# Patient Record
Sex: Male | Born: 1975 | Race: White | Hispanic: No | Marital: Married | Smoking: Former smoker
Health system: Southern US, Community
[De-identification: ages and names within clinical notes are randomized; demographics above are authoritative.]

## PROBLEM LIST (undated history)

## (undated) DIAGNOSIS — Z8719 Personal history of other diseases of the digestive system: Secondary | ICD-10-CM

## (undated) DIAGNOSIS — G35 Multiple sclerosis: Secondary | ICD-10-CM

## (undated) DIAGNOSIS — E669 Obesity, unspecified: Secondary | ICD-10-CM

## (undated) DIAGNOSIS — H469 Unspecified optic neuritis: Secondary | ICD-10-CM

## (undated) HISTORY — PX: HERNIA REPAIR: SHX51

## (undated) HISTORY — DX: Unspecified optic neuritis: H46.9

## (undated) HISTORY — DX: Multiple sclerosis: G35

## (undated) HISTORY — DX: Obesity, unspecified: E66.9

---

## 2013-09-20 ENCOUNTER — Encounter: Payer: Self-pay | Admitting: Neurology

## 2013-09-22 ENCOUNTER — Encounter: Payer: Self-pay | Admitting: Neurology

## 2013-09-22 ENCOUNTER — Ambulatory Visit (INDEPENDENT_AMBULATORY_CARE_PROVIDER_SITE_OTHER): Payer: Commercial Indemnity | Admitting: Neurology

## 2013-09-22 VITALS — BP 142/87 | HR 96 | Ht 73.0 in | Wt 303.0 lb

## 2013-09-22 DIAGNOSIS — G35 Multiple sclerosis: Secondary | ICD-10-CM

## 2013-09-22 DIAGNOSIS — Z5181 Encounter for therapeutic drug level monitoring: Secondary | ICD-10-CM

## 2013-09-22 DIAGNOSIS — G35A Relapsing-remitting multiple sclerosis: Secondary | ICD-10-CM | POA: Insufficient documentation

## 2013-09-22 HISTORY — DX: Multiple sclerosis: G35

## 2013-09-22 NOTE — Progress Notes (Signed)
Reason for visit: Multiple sclerosis  Daniel Chavez is a 37 y.o. male  History of present illness:  Daniel Chavez is a 37 year old left-handed white male with a history of multiple sclerosis that was diagnosed about 6 years ago. The patient presented at that time of the left optic neuritis, which has improved over time. The patient underwent MRI evaluation that showed 12 brain lesions. The patient was started on Copaxone. The patient never underwent a lumbar puncture. The patient has tolerated the Copaxone quite well over time, and he reports no further deficits of numbness or weakness of the face, arms, or legs. The patient denies any gait disturbance, or problems controlling the bowels or the bladder. The patient denies any significant problems with fatigue. The patient recently moved to this area from the [place], [place] area, and he seeks a new neurologist. The patient does not have a primary care physician in this area.  Past Medical History  Diagnosis Date  . MS (multiple sclerosis)   . Multiple sclerosis 09/22/2013  . Obesity   . Optic neuritis, left     Past Surgical History  Procedure Laterality Date  . Hernia repair      2005/2007 Left siide    Family History  Problem Relation Age of Onset  . Leukemia Sister     Social history:  reports that he has quit smoking. He has never used smokeless tobacco. He reports that  drinks alcohol. He reports that he does not use illicit drugs.  Medications:  No current outpatient prescriptions on file prior to visit.   No current facility-administered medications on file prior to visit.     No Known Allergies  ROS:  Out of a complete 14 system review of symptoms, the patient complains only of the following symptoms, and all other reviewed systems are negative.  The patient reports no symptoms.  Blood pressure 142/87, pulse 96, height 6\' 1"  (1.854 m), weight 303 lb (137.44 kg).  Physical Exam  General: The patient is  alert and cooperative at the time of the examination. The patient is markedly obese.  Head: Pupils are equal, round, and reactive to light. Discs are flat bilaterally.  Neck: The neck is supple, no carotid bruits are noted.  Respiratory: The respiratory examination is clear.  Cardiovascular: The cardiovascular examination reveals a regular rate and rhythm, no obvious murmurs or rubs are noted.  Skin: Extremities are without significant edema. The gastrocnemius muscle on the right is larger than that on the left.  Neurologic Exam  Mental status:  Cranial nerves: Facial symmetry is present. There is good sensation of the face to pinprick and soft touch bilaterally. The strength of the facial muscles and the muscles to head turning and shoulder shrug are normal bilaterally. Speech is well enunciated, no aphasia or dysarthria is noted. Extraocular movements are full. Visual fields are full.  Motor: The motor testing reveals 5 over 5 strength of all 4 extremities. Good symmetric motor tone is noted throughout.  Sensory: Sensory testing is intact to pinprick, soft touch, vibration sensation, and position sense on all 4 extremities. No evidence of extinction is noted.  Coordination: Cerebellar testing reveals good finger-nose-finger and heel-to-shin bilaterally.  Gait and station: Gait is normal. Tandem gait is normal. Romberg is negative. No drift is seen.  Reflexes: Deep tendon reflexes are symmetric and normal bilaterally. Toes are downgoing bilaterally.   Assessment/Plan:  One. Multiple sclerosis  The patient has been on Copaxone on a daily basis, and he wishes  to switch to a 3 times a week regimen. The patient has not been taking his medications on a regular basis. The patient has clinically been doing quite well. The patient will have a repeat MRI of the brain and cervical spine with and without gadolinium enhancement. Blood work will be done today. The patient will followup in 6  months.  Daniel Palau MD 09/22/2013 5:01 PM  [facility] [address] [place], [place] [address]  Phone [phone] Fax [phone]

## 2013-09-23 LAB — COMPREHENSIVE METABOLIC PANEL
ALT: 37 IU/L (ref 0–44)
AST: 22 IU/L (ref 0–40)
Albumin/Globulin Ratio: 2 (ref 1.1–2.5)
Albumin: 4.6 g/dL (ref 3.5–5.5)
Alkaline Phosphatase: 74 IU/L (ref 39–117)
BUN/Creatinine Ratio: 19 (ref 8–19)
BUN: 21 mg/dL — ABNORMAL HIGH (ref 6–20)
CO2: 26 mmol/L (ref 18–29)
Calcium: 9.8 mg/dL (ref 8.7–10.2)
Chloride: 101 mmol/L (ref 97–108)
Creatinine, Ser: 1.08 mg/dL (ref 0.76–1.27)
GFR calc Af Amer: 99 mL/min/{1.73_m2} (ref >59)
GFR calc non Af Amer: 86 mL/min/{1.73_m2} (ref >59)
Globulin, Total: 2.3 g/dL (ref 1.5–4.5)
Glucose: 90 mg/dL (ref 65–99)
Potassium: 4.4 mmol/L (ref 3.5–5.2)
Sodium: 144 mmol/L (ref 134–144)
Total Bilirubin: 0.5 mg/dL (ref 0.0–1.2)
Total Protein: 6.9 g/dL (ref 6.0–8.5)

## 2013-09-23 LAB — CBC WITH DIFFERENTIAL
Basophils Absolute: 0 10*3/uL (ref 0.0–0.2)
Basos: 0 %
Eos: 2 %
Eosinophils Absolute: 0.1 10*3/uL (ref 0.0–0.4)
HCT: 42.4 % (ref 37.5–51.0)
Hemoglobin: 14.5 g/dL (ref 12.6–17.7)
Immature Grans (Abs): 0 10*3/uL (ref 0.0–0.1)
Immature Granulocytes: 0 %
Lymphocytes Absolute: 1.7 10*3/uL (ref 0.7–3.1)
Lymphs: 21 %
MCH: 30.1 pg (ref 26.6–33.0)
MCHC: 34.2 g/dL (ref 31.5–35.7)
MCV: 88 fL (ref 79–97)
Monocytes Absolute: 0.7 10*3/uL (ref 0.1–0.9)
Monocytes: 8 %
Neutrophils Absolute: 5.4 10*3/uL (ref 1.4–7.0)
Neutrophils Relative %: 69 %
Platelets: 265 10*3/uL (ref 150–379)
RBC: 4.82 x10E6/uL (ref 4.14–5.80)
RDW: 14.1 % (ref 12.3–15.4)
WBC: 7.9 10*3/uL (ref 3.4–10.8)

## 2013-09-23 NOTE — Progress Notes (Signed)
Quick Note:  LMVM for pt that lab results unremarkable. Pt to call back as needed. ______

## 2013-09-29 ENCOUNTER — Telehealth: Payer: Self-pay | Admitting: Neurology

## 2013-09-30 NOTE — Telephone Encounter (Signed)
I called and spoke to Brett Canales  With [employer].  [phone] and he stated received the new prescription on Friday.  They had tried to verify the pharmacy with pt, was not able to reach.   I gave him the [facility] and [phone].  They will send to pharmacy for processing and this may take few days or could be longer if needs authorization. I LMVM for pt relating to this.

## 2013-10-14 ENCOUNTER — Ambulatory Visit
Admission: RE | Admit: 2013-10-14 | Discharge: 2013-10-14 | Disposition: A | Discharge status: Home / Self Care | Payer: Commercial Indemnity | Source: Ambulatory Visit | Attending: Neurology | Admitting: Neurology

## 2013-10-14 DIAGNOSIS — G35 Multiple sclerosis: Secondary | ICD-10-CM

## 2013-10-14 MED ORDER — GADOBENATE DIMEGLUMINE 529 MG/ML IV SOLN
20.0000 mL | Freq: Once | INTRAVENOUS | Status: AC | PRN
  Administered 2013-10-14: 20 mL via INTRAVENOUS

## 2013-10-18 ENCOUNTER — Telehealth: Payer: Self-pay | Admitting: Neurology

## 2013-10-18 NOTE — Telephone Encounter (Signed)
I called the patient. The MRI of the brain and cervical cord showed chronic MS lesions of the brain and brain stem. No lesions of the cord. Black holes seen. Will stick with the Copaxone for now, will use this study as a comparison for the future.

## 2014-03-24 ENCOUNTER — Ambulatory Visit: Payer: Commercial Indemnity | Admitting: Nurse Practitioner

## 2014-03-29 ENCOUNTER — Ambulatory Visit: Payer: Commercial Indemnity | Admitting: Nurse Practitioner

## 2014-03-29 ENCOUNTER — Telehealth: Payer: Self-pay | Admitting: Nurse Practitioner

## 2014-03-29 NOTE — Telephone Encounter (Signed)
No show for scheduled appt

## 2017-07-20 ENCOUNTER — Emergency Department (HOSPITAL_COMMUNITY)
Admission: EM | Admit: 2017-07-20 | Discharge: 2017-07-21 | Disposition: A | Discharge status: Home / Self Care | Payer: Managed Care, Other (non HMO) | Attending: Emergency Medicine | Admitting: Emergency Medicine

## 2017-07-20 ENCOUNTER — Encounter (HOSPITAL_COMMUNITY): Payer: Self-pay | Admitting: Emergency Medicine

## 2017-07-20 DIAGNOSIS — Z87891 Personal history of nicotine dependence: Secondary | ICD-10-CM | POA: Insufficient documentation

## 2017-07-20 DIAGNOSIS — Y92007 Garden or yard of unspecified non-institutional (private) residence as the place of occurrence of the external cause: Secondary | ICD-10-CM | POA: Diagnosis not present

## 2017-07-20 DIAGNOSIS — G35 Multiple sclerosis: Secondary | ICD-10-CM | POA: Diagnosis not present

## 2017-07-20 DIAGNOSIS — W208XXA Other cause of strike by thrown, projected or falling object, initial encounter: Secondary | ICD-10-CM | POA: Insufficient documentation

## 2017-07-20 DIAGNOSIS — S0592XA Unspecified injury of left eye and orbit, initial encounter: Secondary | ICD-10-CM | POA: Diagnosis present

## 2017-07-20 DIAGNOSIS — S0502XA Injury of conjunctiva and corneal abrasion without foreign body, left eye, initial encounter: Secondary | ICD-10-CM | POA: Insufficient documentation

## 2017-07-20 DIAGNOSIS — Y93H2 Activity, gardening and landscaping: Secondary | ICD-10-CM | POA: Insufficient documentation

## 2017-07-20 DIAGNOSIS — Y999 Unspecified external cause status: Secondary | ICD-10-CM | POA: Insufficient documentation

## 2017-07-20 DIAGNOSIS — K439 Ventral hernia without obstruction or gangrene: Secondary | ICD-10-CM | POA: Insufficient documentation

## 2017-07-20 MED ORDER — ERYTHROMYCIN 5 MG/GM OP OINT: TOPICAL_OINTMENT | Freq: Four times a day (QID) | OPHTHALMIC | 0 refills | Status: DC

## 2017-07-20 MED ORDER — FLUORESCEIN SODIUM 0.6 MG OP STRP
1.0000 | ORAL_STRIP | Freq: Once | OPHTHALMIC | Status: AC
  Administered 2017-07-20: 1 via OPHTHALMIC
  Filled 2017-07-20: qty 1

## 2017-07-20 NOTE — ED Provider Notes (Signed)
[facility] DEPT Provider Note   CSN: [id number] Arrival date & time: 07/20/17  2053   By signing my name below, I, Eunice Blase, attest that this documentation has been prepared under the direction and in the presence of Delora Fuel, MD. Electronically signed, Eunice Blase, ED Scribe. 07/20/17. 11:33 PM.   History   Chief Complaint Chief Complaint  Patient presents with  . Eye Injury  . Hernia   The history is provided by the patient and medical records. No language interpreter was used.    Daniel Chavez is a 41 y.o. male presenting to the Emergency Department concerning blurred vision to the L eye onset earlier today. He states he was using the weed eater when something struck the eye. Associated foreign body sensation and redness to the eye. He states he was wearing contacts when his eye was struck. Pt states he has voluntarily discontinued MS treatment ~4 years ago. Last tetanus 6-7 years ago. Pt followed by Dr. Sheryn Bison at [facility] for primary care. No pain.  He also notes RLQ abdominal pain. H/o hernia repair to the affected area. He states the sensation feels similar to his prior hernia. No fever, chills, diaphoresis, N/V or any other complaints noted at this time. No other complaints at this time.   Past Medical History:  Diagnosis Date  . MS (multiple sclerosis) ([place])   . Multiple sclerosis ([place]) 09/22/2013  . Obesity   . Optic neuritis, left     Patient Active Problem List   Diagnosis Date Noted  . Multiple sclerosis ([place]) 09/22/2013    Past Surgical History:  Procedure Laterality Date  . HERNIA REPAIR     2005/2007 Left siide       Home Medications    Prior to Admission medications   Not on File    Family History Family History  Problem Relation Age of Onset  . Leukemia Sister     Social History Social History  Substance Use Topics  . Smoking status: Former Research scientist (life sciences)  . Smokeless tobacco: Never Used     Comment: quit 03/2006  . Alcohol use Yes       Comment: weekly     Allergies   Patient has no known allergies.   Review of Systems Review of Systems  Constitutional: Negative for fever.  HENT: Negative for facial swelling.   Eyes: Positive for pain, redness and visual disturbance. Negative for photophobia.  Gastrointestinal: Positive for abdominal pain. Negative for nausea and vomiting.  Skin: Negative for wound.  Neurological: Negative for weakness, numbness and headaches.  All other systems reviewed and are negative.    Physical Exam Updated Vital Signs BP 129/87 (BP Location: Right Arm)   Pulse 69   Temp 98.1 F (36.7 C) (Oral)   Resp 18   Ht 6' 1"  (1.854 m)   Wt 240 lb (108.9 kg)   SpO2 96%   BMI 31.66 kg/m   Physical Exam  Constitutional: He is oriented to person, place, and time. He appears well-developed and well-nourished.  HENT:  Head: Normocephalic and atraumatic.  Eyes: Pupils are equal, round, and reactive to light. EOM are normal.  Corneal abrasion to the central cornea of the L eye. No hyphema.   Neck: Normal range of motion. Neck supple. No JVD present.  Cardiovascular: Normal rate, regular rhythm and normal heart sounds.   No murmur heard. Pulmonary/Chest: Effort normal and breath sounds normal. He has no wheezes. He has no rales. He exhibits no tenderness.  Abdominal: Soft. Bowel  sounds are normal. He exhibits no distension and no mass. There is no tenderness. A hernia is present. Hernia confirmed positive in the ventral area.  Surgical scar to lower abdomen. Small ventral hernia to upper abdomen.  Musculoskeletal: Normal range of motion. He exhibits no edema.  Lymphadenopathy:    He has no cervical adenopathy.  Neurological: He is alert and oriented to person, place, and time. No cranial nerve deficit. He exhibits normal muscle tone. Coordination normal.  Skin: Skin is warm and dry. No rash noted.  Psychiatric: He has a normal mood and affect. His behavior is normal. Judgment and thought  content normal.  Nursing note and vitals reviewed.  ED Treatments / Results  DIAGNOSTIC STUDIES: Oxygen Saturation is 96% on RA, adequate by my interpretation.    COORDINATION OF CARE: 11:26 PM-Discussed next steps with pt. Pt verbalized understanding and is agreeable with the plan. Pt prepared for eye exam. Will provide referrals.  Procedures Procedures (including critical care time)  Medications Ordered in ED Medications  fluorescein ophthalmic strip 1 strip (1 strip Left Eye Given 07/20/17 2331)     Initial Impression / Assessment and Plan / ED Course  I have reviewed the triage vital signs and the nursing notes.  Corneal abrasion of the left eye. Small ventral hernia. Eyes stained with flow seen and examined with a Wood's lamp confirming small central corneal abrasion. Visual acuity is normal when using corrective lenses. He is discharged with prescription for erythromycin ointment. Old records were reviewed, and it is noted that he had seen a neurologist for multiple sclerosis for years ago, and did not follow-up. He had been on medication for multiple sclerosis, which she has discontinued on his own. He is encouraged to follow-up with the neurologist. He is also given a referral to [facility] surgery for evaluation of his small ventral hernia.  Final Clinical Impressions(s) / ED Diagnoses   Final diagnoses:  Injury of conjunctiva and corneal abrasion of left eye without foreign body, initial encounter  Ventral hernia without obstruction or gangrene    New Prescriptions New Prescriptions   ERYTHROMYCIN OPHTHALMIC OINTMENT    Place into the left eye 4 (four) times daily. Place a 1/2 inch ribbon of ointment into the lower eyelid.   I personally performed the services described in this documentation, which was scribed in my presence. The recorded information has been reviewed and is accurate.       Delora Fuel, MD 10/21/12 2356

## 2017-07-20 NOTE — ED Triage Notes (Signed)
Patient arrives with complaint of left eye injury. States he was weed eating today and an object was thrown towards his eye. Since then he has had discomfort and some blurry vision in that eye. Also wants to get a hernia checked, points to area above umbilicus.

## 2017-07-20 NOTE — Discharge Instructions (Addendum)
Wear safety glasses, or other eye protection, when using any kind of power tool, including a week whacker.

## 2017-07-21 NOTE — ED Notes (Signed)
Pt stable, ambulatory, states understanding of discharge instructions 

## 2017-07-21 NOTE — ED Notes (Addendum)
Pt couldn't sign for discharge, unable to pull signature pad d/t registration being in the chart (for 16 minutes) and unable to find registration to ask

## 2017-07-23 ENCOUNTER — Ambulatory Visit (INDEPENDENT_AMBULATORY_CARE_PROVIDER_SITE_OTHER): Payer: Managed Care, Other (non HMO) | Admitting: Neurology

## 2017-07-23 ENCOUNTER — Encounter: Payer: Self-pay | Admitting: Neurology

## 2017-07-23 ENCOUNTER — Telehealth: Payer: Self-pay | Admitting: *Deleted

## 2017-07-23 VITALS — BP 135/79 | HR 82 | Ht 73.0 in | Wt 237.5 lb

## 2017-07-23 DIAGNOSIS — G35 Multiple sclerosis: Secondary | ICD-10-CM

## 2017-07-23 MED ORDER — PREDNISONE 10 MG PO TABS: ORAL_TABLET | ORAL | 0 refills | Status: DC

## 2017-07-23 NOTE — Patient Instructions (Signed)
   We will get MRI of the brain and cervical spine, We will need to initiate a medication for MS soon.  I have sent in a prescription for prednisone.

## 2017-07-23 NOTE — Telephone Encounter (Signed)
Called [facility] and spoke with [place]. Scheduled routine pick up, lock box. Confirmation number: [id number].

## 2017-07-23 NOTE — Progress Notes (Signed)
Reason for visit: Multiple sclerosis  Daniel Chavez is a 41 y.o. male  History of present illness:  Mr. Disney is a 41 year old left-handed white male with a history of multiple sclerosis. The patient was last seen in 2014, he was placed on Copaxone, but he never took the medication. The patient has been off of therapy for quite a number of years. The patient has been doing well, but about 6 months ago he had transient numbness involving the left leg. The patient attributed this to a back problem. The patient began having new symptoms 6 weeks ago, he began having severe fatigue and cognitive slowing. This has persisted. The patient has had some sensations of dizziness, he has developed slurred speech, he continues to have "brain fog" that has helped the fatigue issue. The patient has had blood work done through his primary care physician that includes a CBC and differential, comprehensive metabolic profile, thyroid profile, sedimentation rate, and urinalysis. White blood count is 7.9, hemoglobin of 15.2, platelets of 240. BUN and creatinine are normal at 20/0.94. Liver function tests are normal. Chemistry profile is normal. The patient has had some mild balance issues, he may stumble on occasion. He denies issues controlling the bowels or the bladder. He has not had any double vision or loss of vision. He continues to have dysarthria, and some problems with swallowing. He returns to the office today for an evaluation.  Past Medical History:  Diagnosis Date  . MS (multiple sclerosis) ([place])   . Multiple sclerosis ([place]) 09/22/2013  . Obesity   . Optic neuritis, left     Past Surgical History:  Procedure Laterality Date  . HERNIA REPAIR     2005/2007 Left siide    Family History  Problem Relation Age of Onset  . Leukemia Sister     Social history:  reports that he has quit smoking. He has never used smokeless tobacco. He reports that he does not drink alcohol or use  drugs.  Medications:  Prior to Admission medications   Medication Sig Start Date End Date Taking? Authorizing Provider  erythromycin ophthalmic ointment Place into the left eye 4 (four) times daily. Place a 1/2 inch ribbon of ointment into the lower eyelid. 7/37/10  Yes Delora Fuel, MD  predniSONE (DELTASONE) 10 MG tablet Begin taking 6 tablets daily, taper by one tablet every other day until off the medication. 07/23/17   Kathrynn Ducking, MD     No Known Allergies  ROS:  Out of a complete 14 system review of symptoms, the patient complains only of the following symptoms, and all other reviewed systems are negative.  Fatigue Difficulty swallowing Confusion, numbness, slurred speech, dizziness Decreased energy, disinterest in activities Sleepiness  Blood pressure 135/79, pulse 82, height 6' 1"  (1.854 m), weight 237 lb 8 oz (107.7 kg).  Physical Exam  General: The patient is alert and cooperative at the time of the examination.  Eyes: Pupils are equal, round, and reactive to light. Discs are flat bilaterally.  Neck: The neck is supple, no carotid bruits are noted.  Respiratory: The respiratory examination is clear.  Cardiovascular: The cardiovascular examination reveals a regular rate and rhythm, no obvious murmurs or rubs are noted.  Skin: Extremities are without significant edema.  Neurologic Exam  Mental status: The patient is alert and oriented x 3 at the time of the examination. The patient has apparent normal recent and remote memory, with an apparently normal attention span and concentration ability.  Cranial nerves:  Facial symmetry is present. There is good sensation of the face to pinprick and soft touch bilaterally. The strength of the facial muscles and the muscles to head turning and shoulder shrug are normal bilaterally. Speech is slightly dysarthric, not aphasic. Extraocular movements are full. Visual fields are full. The tongue is midline, and the patient has  symmetric elevation of the soft palate. No obvious hearing deficits are noted.  Motor: The motor testing reveals 5 over 5 strength of all 4 extremities. Good symmetric motor tone is noted throughout.  Sensory: Sensory testing is intact to pinprick, soft touch, vibration sensation, and position sense on all 4 extremities. No evidence of extinction is noted.  Coordination: Cerebellar testing reveals good finger-nose-finger and heel-to-shin bilaterally.  Gait and station: Gait is normal. Tandem gait is slightly unsteady. Romberg is negative. No drift is seen.  Reflexes: Deep tendon reflexes are symmetric and normal bilaterally. Toes are downgoing bilaterally.   Assessment/Plan:  1. Multiple sclerosis  2. Medical noncompliance  The patient has not taken any disease modifying agents in greater than 4 years. The patient was on Copaxone, but he never took the medication. He has been lost to follow-up. He returns with new symptoms of fatigue, dysarthria, and cognitive slowing. The patient will be placed on a prednisone Dosepak, 10 mg 12 day pack. He will be sent for MRI evaluation of the brain and cervical spine, and he will have blood work done today. We will consider Tysabri if his JC viral antibody titers are negative. Otherwise, he may go on another medication such as Gilenya or Ocrevus. He will follow-up in 3 months.  Jill Alexanders MD 07/23/2017 9:52 AM  [facility] [address] [place], [place] [address]  Phone [phone] Fax [phone]

## 2017-07-24 LAB — HEPATITIS B CORE ANTIBODY, TOTAL: Hep B Core Total Ab: NEGATIVE

## 2017-07-24 LAB — HEPATITIS B SURFACE ANTIGEN: Hepatitis B Surface Ag: NEGATIVE

## 2017-07-24 LAB — HEPATITIS C ANTIBODY: Hep C Virus Ab: <0.1 s/co ratio (ref 0.0–0.9)

## 2017-07-24 LAB — VARICELLA ZOSTER ANTIBODY, IGG: Varicella zoster IgG: 2864 index (ref >165)

## 2017-07-25 ENCOUNTER — Telehealth: Payer: Self-pay | Admitting: Neurology

## 2017-07-25 MED ORDER — ALPRAZOLAM 0.5 MG PO TABS: 0.5000 mg | ORAL_TABLET | Freq: Every evening | ORAL | 0 refills | Status: DC | PRN

## 2017-07-25 NOTE — Addendum Note (Signed)
Addended by: Kathrynn Ducking on: 07/25/2017 12:06 PM   Modules accepted: Orders

## 2017-07-25 NOTE — Telephone Encounter (Signed)
Faxed printed/signed rx xanax to pt pharmacy. [place], [place] [phone] (Phone) [phone] (Fax)   Received fax confirmation.

## 2017-07-25 NOTE — Telephone Encounter (Signed)
Pt wife calling with questions about predniSONE (DELTASONE) 10 MG tablet, please call.  Pt wife also has questions re: possibly have pt treated by Dr Felecia Shelling and what things are looked for when pt's are asked if they have traveled out of the [place] within the past 21 days.  Please call

## 2017-07-25 NOTE — Telephone Encounter (Signed)
I called the patient. He is having some jitteriness on the prednisone, difficulty sleeping. He is having some fatigue the next day that may be related to the lack of sleep.  We will add alprazolam and low-dose to take if needed for sleep while he is on prednisone.

## 2017-07-28 ENCOUNTER — Telehealth: Payer: Self-pay | Admitting: *Deleted

## 2017-07-28 LAB — QUANTIFERON TB GOLD ASSAY (BLOOD)

## 2017-07-28 LAB — QUANTIFERON IN TUBE
QFT TB AG MINUS NIL VALUE: 0.01 IU/mL
QUANTIFERON MITOGEN VALUE: >10 IU/mL
QUANTIFERON TB AG VALUE: 0.04 IU/mL
QUANTIFERON TB GOLD: NEGATIVE
Quantiferon Nil Value: 0.03 IU/mL

## 2017-07-28 NOTE — Telephone Encounter (Signed)
Called and LVM for pt about lab results per CW,MD note. Gave [facility] phone number if he has further questions or concerns.

## 2017-07-28 NOTE — Telephone Encounter (Signed)
-----   Message from Kathrynn Ducking, MD sent at 07/28/2017  7:24 AM EDT ----- The blood work is unremarkable, the JC viral antibody panel is still pending, will call when we get this result. Please call the patient. ----- Message ----- From: Lavone Neri Lab Results In Sent: 07/24/2017   7:44 AM To: Kathrynn Ducking, MD

## 2017-07-29 ENCOUNTER — Other Ambulatory Visit: Payer: Self-pay | Admitting: Neurology

## 2017-07-29 ENCOUNTER — Encounter: Payer: Self-pay | Admitting: Neurology

## 2017-07-29 ENCOUNTER — Telehealth: Payer: Self-pay | Admitting: Neurology

## 2017-07-29 DIAGNOSIS — R410 Disorientation, unspecified: Secondary | ICD-10-CM

## 2017-07-29 DIAGNOSIS — R404 Transient alteration of awareness: Secondary | ICD-10-CM

## 2017-07-29 NOTE — Telephone Encounter (Signed)
I called the wife. The patient has had significant cognitive issues today, he had been doing better on the oral prednisone taper, we will have him come in and get 500 mg of IV Solu-Medrol today and then do a full 3 day course.

## 2017-07-29 NOTE — Telephone Encounter (Signed)
Pt's wife called the office said he is having a severe "brain fog" today and does think the steroids are working. She said he is trying to work today but is having a lot of difficulty. She mentioned taking him to ED but wanted to wait for call back. Please call asap

## 2017-07-30 ENCOUNTER — Other Ambulatory Visit: Payer: Self-pay | Admitting: Neurology

## 2017-07-30 ENCOUNTER — Other Ambulatory Visit: Payer: Self-pay

## 2017-07-30 DIAGNOSIS — R410 Disorientation, unspecified: Secondary | ICD-10-CM

## 2017-07-30 DIAGNOSIS — R404 Transient alteration of awareness: Secondary | ICD-10-CM

## 2017-08-01 ENCOUNTER — Telehealth: Payer: Self-pay | Admitting: Neurology

## 2017-08-01 MED ORDER — BUPROPION HCL ER (SR) 150 MG PO TB12: 150.0000 mg | ORAL_TABLET | Freq: Every day | ORAL | 3 refills | Status: DC

## 2017-08-01 NOTE — Telephone Encounter (Signed)
I called the wife. The patient has had some problems with depression of the last several weeks, I will call in low dose of the Wellbutrin taken 150 mg XR tablet daily.  We are still waiting on the JC viral antibody panel.  The blood work so far shown a mild elevation in sodium and calcium levels, we'll follow this.

## 2017-08-01 NOTE — Telephone Encounter (Signed)
Pt wife(on DPR) calling asking for a call back re: depression, she feels pt is re: relapses and would like to know if there is medication or a psychiatrist pt could be referred to, please call.

## 2017-08-01 NOTE — Addendum Note (Signed)
Addended by: Kathrynn Ducking on: 08/01/2017 11:35 AM   Modules accepted: Orders

## 2017-08-03 ENCOUNTER — Ambulatory Visit
Admission: RE | Admit: 2017-08-03 | Discharge: 2017-08-03 | Disposition: A | Discharge status: Home / Self Care | Payer: Commercial Indemnity | Source: Ambulatory Visit | Attending: Neurology | Admitting: Neurology

## 2017-08-03 DIAGNOSIS — G35 Multiple sclerosis: Secondary | ICD-10-CM

## 2017-08-03 LAB — COMPREHENSIVE METABOLIC PANEL
ALT: 20 IU/L (ref 0–44)
AST: 11 IU/L (ref 0–40)
Albumin/Globulin Ratio: 2.1 (ref 1.2–2.2)
Albumin: 4.6 g/dL (ref 3.5–5.5)
Alkaline Phosphatase: 53 IU/L (ref 39–117)
BUN/Creatinine Ratio: 17 (ref 9–20)
BUN: 16 mg/dL (ref 6–24)
Bilirubin Total: 0.8 mg/dL (ref 0.0–1.2)
CO2: 26 mmol/L (ref 20–29)
Calcium: 10.5 mg/dL — ABNORMAL HIGH (ref 8.7–10.2)
Chloride: 102 mmol/L (ref 96–106)
Creatinine, Ser: 0.92 mg/dL (ref 0.76–1.27)
GFR calc Af Amer: 117 mL/min/{1.73_m2} (ref >59)
GFR calc non Af Amer: 102 mL/min/{1.73_m2} (ref >59)
Globulin, Total: 2.2 g/dL (ref 1.5–4.5)
Glucose: 100 mg/dL — ABNORMAL HIGH (ref 65–99)
Potassium: 5.1 mmol/L (ref 3.5–5.2)
Sodium: 146 mmol/L — ABNORMAL HIGH (ref 134–144)
Total Protein: 6.8 g/dL (ref 6.0–8.5)

## 2017-08-03 LAB — VITAMIN D 1,25 DIHYDROXY
Vitamin D 1, 25 (OH)2 Total: 42 pg/mL
Vitamin D2 1, 25 (OH)2: <10 pg/mL
Vitamin D3 1, 25 (OH)2: 41 pg/mL

## 2017-08-03 LAB — VITAMIN B1: Thiamine: 155 nmol/L (ref 66.5–200.0)

## 2017-08-03 LAB — VITAMIN B12: Vitamin B-12: 959 pg/mL (ref 232–1245)

## 2017-08-03 MED ORDER — GADOBENATE DIMEGLUMINE 529 MG/ML IV SOLN
20.0000 mL | Freq: Once | INTRAVENOUS | Status: AC | PRN
  Administered 2017-08-03: 20 mL via INTRAVENOUS

## 2017-08-04 ENCOUNTER — Telehealth: Payer: Self-pay | Admitting: Neurology

## 2017-08-04 NOTE — Telephone Encounter (Signed)
I called the patient and wife. MRI of the brain and cervical spinal cord does show significant progression of MS over the last 4 years.  We are waiting on the JC virus antibody, we will make decision about what medication to go on once this comes back.    MRI brain 08/03/17:  IMPRESSION:    This MRI of the brain with and without contrast shows the following: 1.   Multiple T2/FLAIR hyperintense foci in the cerebellum, brainstem, left thalamus and in the periventricular, juxtacortical and deep white matter of both hemispheres.   A few of the hemispheric lesions are tumefactive in appearance. None of the lesions enhance.     However, when compared to the 2014 MRI, most of the lesions in the posterior fossa and thalamus and many of the foci in the hemispheres including all of the larger foci have occurred since the 2014 MRI. 2.   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   MRI cervical 08/03/17:  IMPRESSION:  This MRI of the cervical spine with and without contrast shows the following: 1.   Foci within the spinal cord adjacent to C3-C4, C4-C5 and C5-C6. The focus adjacent to C4-C5 is seen on the 2014 MRI but the other 2 have occurred during the interim.   None of the lesions enhance 2.   Mild disc bulging at C5-C6 and C6-C7 that does not lead to any nerve root compression. 3.   There is a normal enhancement pattern.

## 2017-08-05 ENCOUNTER — Telehealth: Payer: Self-pay | Admitting: Neurology

## 2017-08-05 MED ORDER — MODAFINIL 200 MG PO TABS: 200.0000 mg | ORAL_TABLET | Freq: Every day | ORAL | 3 refills | Status: DC

## 2017-08-05 NOTE — Addendum Note (Signed)
Addended by: Kathrynn Ducking on: 08/05/2017 05:12 PM   Modules accepted: Orders

## 2017-08-05 NOTE — Telephone Encounter (Signed)
I called the patient. The patient is having a good day with cognitive functioning and then he'll have a bad day. We will try Provigil to see if this helps some, the patient may consider FMLA, we may fall out of work for 4-6 weeks.  I still not heard anything about the JC virus antibody, I will try to get my nurse to call about this.  The Provigil likely would need prior authorization.

## 2017-08-05 NOTE — Telephone Encounter (Signed)
Called [facility] to check on status of lab results for JCV. Spoke with Danae Chen who stated lab still pending. Labs are sent and ran in [place] and can take up to two weeks to come back. She was unable to check to see when [place] received lab. She transferred me to [facility] (client services). She states they had to pull more samples from lab draw on 08/01/17. They needed further samples. She was unable to reach out to someone to check to see if results are pending or not.   She will contact me tomorrow to give an update. I gave her [phone] and advised her to ask to speak with me.   I faxed printed/signed rx modafinil to pt pharmacy. Fax: [phone]. Received confirmation.

## 2017-08-05 NOTE — Telephone Encounter (Signed)
Patient calling and says he is having MS issues. Today he started having bad nausea and brain fog. Please call and discuss.

## 2017-08-06 NOTE — Telephone Encounter (Signed)
Pt called the clinic checking on status of labs. I advised him the labs are still pending but RN spoke with lab yesterday and is waiting for a call back today. I told him RN will check back with lab if she does not hear back from them today. FYI

## 2017-08-06 NOTE — Telephone Encounter (Signed)
Pt called the clinic I advised him RN had called [facility] but did not have any information to give him at this time but she would call when she had an answer. He was appreciative.

## 2017-08-06 NOTE — Telephone Encounter (Signed)
Called [facility] again since no call back for an update from [place].  Spoke with Katharine Look L. She stated results still showing as pending. Sample good for 14 days (unless frozen), which would be today. She is going to place request as a priority one to go to management team. She is not sure what is going on.Department is going to call ASAP, within 24 hours to let our office know what is going on. I gave phone number [phone] for call back.  Advised our office closes at [place].

## 2017-08-07 ENCOUNTER — Other Ambulatory Visit (INDEPENDENT_AMBULATORY_CARE_PROVIDER_SITE_OTHER): Payer: Self-pay

## 2017-08-07 ENCOUNTER — Telehealth: Payer: Self-pay | Admitting: Neurology

## 2017-08-07 ENCOUNTER — Telehealth: Payer: Self-pay | Admitting: *Deleted

## 2017-08-07 DIAGNOSIS — Z0289 Encounter for other administrative examinations: Secondary | ICD-10-CM

## 2017-08-07 DIAGNOSIS — G35 Multiple sclerosis: Secondary | ICD-10-CM

## 2017-08-07 NOTE — Telephone Encounter (Signed)
Submitted PA modafinil to [employer] on covermymeds. Key: [id number] - PA Case ID: [id number] - Rx #: [id number]. Awaiting response.

## 2017-08-07 NOTE — Telephone Encounter (Signed)
Daniel Chavez @ [facility] re: Daniel Chavez, Daniel Chavez called and wanted to know if someone followed up about patient. Advised we received fax from [place] this am that processing was messed up. Needed a re-draw. Advised we already called pt and he came this am. [facility] already picked [facility] lab. I asked for lab to be rushed/placed as stat. She states it has to be sent to [place] but they note for it to be done ASAP.

## 2017-08-07 NOTE — Telephone Encounter (Signed)
I called patient. We contacted [facility], they received the blood sample, but never ran the [place] virus antibody, we will need to have this redone, the patient will come in to have blood work done today.

## 2017-08-07 NOTE — Telephone Encounter (Signed)
Pt wife calling asking for a call back re: the JC virus results and also re: medication called into the pharmacy [facility]) she would like to know if the form was received by fax for the prior authorization needed for pt's latest medication.

## 2017-08-07 NOTE — Telephone Encounter (Signed)
Called [facility] at [phone]. Spoke with [employer]. Tried to schedule lab pick up for stat. He states test not eligable for stat pick up. Scheduled routine pick up, lock box. He will send message to logistics to see if someone can come out to pick up lab sooner than 6pm.  Confirmation number: [id number]

## 2017-08-07 NOTE — Telephone Encounter (Signed)
I called the wife, left a message. The patient has come in today to get the blood drawn for the JC virus antibody again.  We will try to get this done quickly, we have requested that this be done as soon as possible.  The prior authorization for Provigil is underway.

## 2017-08-08 NOTE — Telephone Encounter (Signed)
Pt has called for the status of his medication, he called the pharmacy and was given the [phone] , pt asking RN to call this # to move forward in the processing of his medication.  Pt is asking for a call back

## 2017-08-08 NOTE — Telephone Encounter (Signed)
Received approval for modafinil 240m 07-29-16-18 thru 08-08-2018.  ID [id number]

## 2017-08-08 NOTE — Telephone Encounter (Signed)
Called and spoke to [place] at [facility] and she did run it and it did go thru for $12.50.   She will let pt know.

## 2017-08-11 ENCOUNTER — Telehealth: Payer: Self-pay | Admitting: *Deleted

## 2017-08-11 NOTE — Telephone Encounter (Signed)
Received fax notification from [place] that PA modafinil approved effective 07/1717-08/08/18.

## 2017-08-12 ENCOUNTER — Telehealth: Payer: Self-pay | Admitting: Neurology

## 2017-08-12 NOTE — Telephone Encounter (Signed)
Called [facility] back. Spoke with Vicente Males and Nicole Kindred.  They contacted [place] site where sample was sent. No issues with sample. They are running sample now and should be resulted by 08/14/17.   They think original message was in regards to first lab draw. Advised me to disregard that message. Apologized for any confusion.

## 2017-08-12 NOTE — Telephone Encounter (Signed)
Carolotta with [facility] called and stated they are not able to do the JCV antibody test. The phone number is [phone] and the reference # was [id number].

## 2017-08-13 ENCOUNTER — Telehealth: Payer: Self-pay | Admitting: *Deleted

## 2017-08-13 NOTE — Telephone Encounter (Signed)
Pt would like a call back re: the results of the JCV results please call

## 2017-08-13 NOTE — Telephone Encounter (Signed)
Gave completed/signed FMLA form to medical records to process for patient.

## 2017-08-13 NOTE — Telephone Encounter (Signed)
Called patient back. Advised I spoke with [facility] yesterday afternoon and the advised we should have results by tomorrow. We will call once results come back. He verbalized understanding and appreciation for call.

## 2017-08-13 NOTE — Telephone Encounter (Signed)
Called and spoke to pt. Advised we received FMLA paperwork and currently working on completing. Needed more info. Pt states he is requesting single continuous period starting 08/11/17 for 6-8 weeks.  Advised I will give to CW,MD to review/complete.  Pt verbalized understanding.

## 2017-08-14 ENCOUNTER — Telehealth: Payer: Self-pay | Admitting: *Deleted

## 2017-08-14 NOTE — Telephone Encounter (Addendum)
Called and spoke with Karna Christmas at [facility] to check on status of [facility] lab drawn on 08/07/17. She stated lab still showing pending. I asked if she could contact lab to find out more information. She placed me on hold. Spoke with Estill Bamberg with [facility] who stated lab in process of being run. Should have results 08/14/17 (tonight). Latest by 8/25 if test comes back positive and they have to re-run test.   Called and relayed this information to the patient. He verbalized understanding and appreciation for the call.

## 2017-08-18 DIAGNOSIS — Z0289 Encounter for other administrative examinations: Secondary | ICD-10-CM

## 2017-08-18 NOTE — Telephone Encounter (Signed)
Called and spoke with Sharee Pimple from [facility]. She call [facility] and they told her there was a problem with transmission of the results. They will try resubmitting. She states once it clears the system she will fax me report to [phone].  I also gave [facility] phone number if she has further questions.

## 2017-08-18 NOTE — Telephone Encounter (Signed)
Patient called office in reference to see if we have received his JCV lab results.  Please call

## 2017-08-19 ENCOUNTER — Encounter: Payer: Self-pay | Admitting: *Deleted

## 2017-08-19 ENCOUNTER — Other Ambulatory Visit: Payer: Self-pay | Admitting: Neurology

## 2017-08-19 NOTE — Telephone Encounter (Signed)
Received faxed report from [place] antibody by inhibition final result: Positive.

## 2017-08-19 NOTE — Telephone Encounter (Signed)
Called and spoke with patient. 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 Okay to initiate on tysabri and recheck JCV every 6 months. We would continue to monitor him closely.  May have to switch medication later on. His risk increases the longer he is on medication.   Pt will come today to start tysabri start form.

## 2017-08-19 NOTE — Telephone Encounter (Signed)
Patient came in office today. Signed start form. I gave him patient packet/info on Tysabri to bring home.

## 2017-08-19 NOTE — Telephone Encounter (Signed)
Forms were signed.

## 2017-08-19 NOTE — Telephone Encounter (Signed)
Forms in process of being completed. Dr Felecia Shelling will sign since Dr Jannifer Franklin out of office. compelted forms will be given to [facility]) (Tina/Mindy).

## 2017-08-22 ENCOUNTER — Telehealth: Payer: Self-pay | Admitting: *Deleted

## 2017-08-22 NOTE — Telephone Encounter (Signed)
Called and spoke with patient wife. 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 Reassured that Dr Julius Bowels Felecia Shelling reviewed results and comfortable with him starting on medication.   He states [name] never received FMLA forms. Advised I will speak with medical records to resend forms. I sent Angela Nevin. Message to resend this.   She also wanted to know status of approval for tysabri. Advised I will give message to our infusion center to check on the status of this.   She verbalized understanding. Has no further questions at this time.

## 2017-08-26 ENCOUNTER — Other Ambulatory Visit: Payer: Self-pay | Admitting: Neurology

## 2017-08-26 ENCOUNTER — Telehealth: Payer: Self-pay | Admitting: Neurology

## 2017-08-26 MED ORDER — PREDNISONE 10 MG PO TABS: ORAL_TABLET | ORAL | 0 refills | Status: DC

## 2017-08-26 NOTE — Telephone Encounter (Signed)
I called patient. He is having several day history of increased fatigue, brain followed, and dizziness, difficulty focusing.  This likely is not a side effect of the Provigil, I will call in a prescription for prednisone.  The patient has not yet started Tysabri.  The patient indicates that he may need hernia surgery in the near future.

## 2017-08-26 NOTE — Telephone Encounter (Signed)
Pt called he is having vertigo and a hard time focusing since last Wed or Thursday. He is wanting to know if this could be MS or medication related. He has not started any new medications

## 2017-08-27 ENCOUNTER — Emergency Department (HOSPITAL_COMMUNITY)
Admission: EM | Admit: 2017-08-27 | Discharge: 2017-08-27 | Disposition: A | Discharge status: Home / Self Care | Payer: Managed Care, Other (non HMO) | Attending: Emergency Medicine | Admitting: Emergency Medicine

## 2017-08-27 ENCOUNTER — Telehealth: Payer: Self-pay | Admitting: *Deleted

## 2017-08-27 ENCOUNTER — Encounter (HOSPITAL_COMMUNITY): Payer: Self-pay | Admitting: *Deleted

## 2017-08-27 ENCOUNTER — Other Ambulatory Visit: Payer: Self-pay | Admitting: General Surgery

## 2017-08-27 DIAGNOSIS — Z87891 Personal history of nicotine dependence: Secondary | ICD-10-CM | POA: Insufficient documentation

## 2017-08-27 DIAGNOSIS — R42 Dizziness and giddiness: Secondary | ICD-10-CM | POA: Diagnosis present

## 2017-08-27 DIAGNOSIS — E860 Dehydration: Secondary | ICD-10-CM | POA: Insufficient documentation

## 2017-08-27 DIAGNOSIS — G35 Multiple sclerosis: Secondary | ICD-10-CM | POA: Insufficient documentation

## 2017-08-27 DIAGNOSIS — K432 Incisional hernia without obstruction or gangrene: Secondary | ICD-10-CM

## 2017-08-27 LAB — CBC WITH DIFFERENTIAL/PLATELET
Basophils Absolute: 0 10*3/uL (ref 0.0–0.1)
Basophils Relative: 0 %
Eosinophils Absolute: 0 10*3/uL (ref 0.0–0.7)
Eosinophils Relative: 0 %
HCT: 42.5 % (ref 39.0–52.0)
Hemoglobin: 13.9 g/dL (ref 13.0–17.0)
Lymphocytes Relative: 8 %
Lymphs Abs: 0.5 10*3/uL — ABNORMAL LOW (ref 0.7–4.0)
MCH: 29.4 pg (ref 26.0–34.0)
MCHC: 32.7 g/dL (ref 30.0–36.0)
MCV: 90 fL (ref 78.0–100.0)
Monocytes Absolute: 0.2 10*3/uL (ref 0.1–1.0)
Monocytes Relative: 3 %
Neutro Abs: 5.6 10*3/uL (ref 1.7–7.7)
Neutrophils Relative %: 89 %
Platelets: 308 10*3/uL (ref 150–400)
RBC: 4.72 MIL/uL (ref 4.22–5.81)
RDW: 13.4 % (ref 11.5–15.5)
WBC: 6.3 10*3/uL (ref 4.0–10.5)

## 2017-08-27 LAB — COMPREHENSIVE METABOLIC PANEL
ALT: 18 U/L (ref 17–63)
AST: 18 U/L (ref 15–41)
Albumin: 4 g/dL (ref 3.5–5.0)
Alkaline Phosphatase: 53 U/L (ref 38–126)
Anion gap: 11 (ref 5–15)
BUN: 12 mg/dL (ref 6–20)
CO2: 25 mmol/L (ref 22–32)
Calcium: 9.8 mg/dL (ref 8.9–10.3)
Chloride: 105 mmol/L (ref 101–111)
Creatinine, Ser: 1.01 mg/dL (ref 0.61–1.24)
GFR calc Af Amer: >60 mL/min (ref >60)
GFR calc non Af Amer: >60 mL/min (ref >60)
Glucose, Bld: 113 mg/dL — ABNORMAL HIGH (ref 65–99)
Potassium: 4.1 mmol/L (ref 3.5–5.1)
Sodium: 141 mmol/L (ref 135–145)
Total Bilirubin: 0.8 mg/dL (ref 0.3–1.2)
Total Protein: 7 g/dL (ref 6.5–8.1)

## 2017-08-27 LAB — I-STAT TROPONIN, ED: Troponin i, poc: 0 ng/mL (ref 0.00–0.08)

## 2017-08-27 MED ORDER — MECLIZINE HCL 25 MG PO TABS
12.5000 mg | ORAL_TABLET | Freq: Once | ORAL | Status: AC
  Administered 2017-08-27: 12.5 mg via ORAL
  Filled 2017-08-27: qty 1

## 2017-08-27 MED ORDER — SODIUM CHLORIDE 0.9 % IV BOLUS (SEPSIS)
1000.0000 mL | Freq: Once | INTRAVENOUS | Status: AC
Start: 2017-08-27 — End: 2017-08-27
  Administered 2017-08-27: 1000 mL via INTRAVENOUS

## 2017-08-27 MED ORDER — MECLIZINE HCL 12.5 MG PO TABS: 12.5000 mg | ORAL_TABLET | Freq: Three times a day (TID) | ORAL | 0 refills | Status: DC | PRN

## 2017-08-27 MED ORDER — SODIUM CHLORIDE 0.9 % IV BOLUS (SEPSIS)
1000.0000 mL | Freq: Once | INTRAVENOUS | Status: AC
  Administered 2017-08-27: 1000 mL via INTRAVENOUS

## 2017-08-27 NOTE — ED Triage Notes (Signed)
Pt c/o dizziness x 3 wks, pt seen by PCP, pt c/o nausea, pt had orthostatic hypotension per spouse at PCP office, pt denies v/d, pt A&O x4, pt taking Prednisone today for MS flare

## 2017-08-27 NOTE — Telephone Encounter (Signed)
Gave completed/signed disability form from [place] back to medical records to process for patient.

## 2017-08-27 NOTE — ED Notes (Signed)
ED Provider at bedside. 

## 2017-08-27 NOTE — ED Notes (Signed)
Pt departed in NAD, refused use of wheelchair.  

## 2017-08-27 NOTE — Discharge Instructions (Signed)
As we discussed, we believe her symptoms are caused today by mild volume depletion, or mild dehydration, without any evidence of damage to your body.  Please drink plenty of clear fluids such as water and/or Gatorade and follow up with your regular doctor or the doctors listed in his documentation at the next available opportunity.  Return to the emergency department with any new or worsening symptoms that concern you, including but not limited to fever, shortness of breath, chest pain, or other concerning symptoms.   Dehydration, Adult Dehydration is when you lose more fluids from the body than you take in. Vital organs like the kidneys, brain, and heart cannot function without a proper amount of fluids and salt. Any loss of fluids from the body can cause dehydration.  CAUSES  Vomiting. Diarrhea. Excessive sweating. Excessive urine output. Fever. SYMPTOMS  Mild dehydration Thirst. Dry lips. Slightly dry mouth. Moderate dehydration Very dry mouth. Sunken eyes. Skin does not bounce back quickly when lightly pinched and released. Dark urine and decreased urine production. Decreased tear production. Headache. Severe dehydration Very dry mouth. Extreme thirst. Rapid, weak pulse (more than 100 beats per minute at rest). Cold hands and feet. Not able to sweat in spite of heat and temperature. Rapid breathing. Blue lips. Confusion and lethargy. Difficulty being awakened. Minimal urine production. No tears. DIAGNOSIS  Your caregiver will diagnose dehydration based on your symptoms and your exam. Blood and urine tests will help confirm the diagnosis. The diagnostic evaluation should also identify the cause of dehydration. TREATMENT  Treatment of mild or moderate dehydration can often be done at home by increasing the amount of fluids that you drink. It is best to drink small amounts of fluid more often. Drinking too much at one time can make vomiting worse. Refer to the home care  instructions below. Severe dehydration needs to be treated at the hospital where you will probably be given intravenous (IV) fluids that contain water and electrolytes. HOME CARE INSTRUCTIONS  Ask your caregiver about specific rehydration instructions. Drink enough fluids to keep your urine clear or pale yellow. Drink small amounts frequently if you have nausea and vomiting. Eat as you normally do. Avoid: Foods or drinks high in sugar. Carbonated drinks. Juice. Extremely hot or cold fluids. Drinks with caffeine. Fatty, greasy foods. Alcohol. Tobacco. Overeating. Gelatin desserts. Wash your hands well to avoid spreading bacteria and viruses. Only take over-the-counter or prescription medicines for pain, discomfort, or fever as directed by your caregiver. Ask your caregiver if you should continue all prescribed and over-the-counter medicines. Keep all follow-up appointments with your caregiver. SEEK MEDICAL CARE IF: You have abdominal pain and it increases or stays in one area (localizes). You have a rash, stiff neck, or severe headache. You are irritable, sleepy, or difficult to awaken. You are weak, dizzy, or extremely thirsty. SEEK IMMEDIATE MEDICAL CARE IF:  You are unable to keep fluids down or you get worse despite treatment. You have frequent episodes of vomiting or diarrhea. You have blood or green matter (bile) in your vomit. You have blood in your stool or your stool looks black and tarry. You have not urinated in 6 to 8 hours, or you have only urinated a small amount of very dark urine. You have a fever. You faint. MAKE SURE YOU:  Understand these instructions. Will watch your condition. Will get help right away if you are not doing well or get worse. Document Released: 12/09/2005 Document Revised: 03/02/2012 Document Reviewed: 07/29/2011 Exit[employer] Patient Information 2015  [employer]. This information is not intended to replace advice given to you by your health  care provider. Make sure you discuss any questions you have with your health care provider.  Rehydration, Adult Rehydration is the replacement of body fluids lost during dehydration. Dehydration is an extreme loss of body fluids to the point of body function impairment. There are many ways extreme fluid loss can occur, including vomiting, diarrhea, or excess sweating. Recovering from dehydration requires replacing lost fluids, continuing to eat to maintain strength, and avoiding foods and beverages that may contribute to further fluid loss or may increase nausea. HOW TO REHYDRATE In most cases, rehydration involves the replacement of not only fluids but also carbohydrates and basic body salts. Rehydration with an oral rehydration solution is one way to replace essential nutrients lost through dehydration. An oral rehydration solution can be purchased at pharmacies, retail stores, and online. Premixed packets of powder that you combine with water to make a solution are also sold. You can prepare an oral rehydration solution at home by mixing the following ingredients together:   - tsp table salt.  tsp baking soda.  tsp salt substitute containing potassium chloride. 1 tablespoons sugar. 1 L (34 oz) of water. Be sure to use exact measurements. Including too much sugar can make diarrhea worse. Drink -1 cup (120-240 mL) of oral rehydration solution each time you have diarrhea or vomit. If drinking this amount makes your vomiting worse, try drinking smaller amounts more often. For example, drink 1-3 tsp every 5-10 minutes.  A general rule for staying hydrated is to drink 1-2 L of fluid per day. Talk to your caregiver about the specific amount you should be drinking each day. Drink enough fluids to keep your urine clear or pale yellow. EATING WHEN DEHYDRATED Even if you have had severe sweating or you are having diarrhea, do not stop eating. Many healthy items in a normal diet are okay to continue eating  while recovering from dehydration. The following tips can help you to lessen nausea when you eat: Ask someone else to prepare your food. Cooking smells may worsen nausea. Eat in a well-ventilated room away from cooking smells. Sit up when you eat. Avoid lying down until 1-2 hours after eating. Eat small amounts when you eat. Eat foods that are easy to digest. These include soft, well-cooked, or mashed foods. FOODS AND BEVERAGES TO AVOID Avoid eating or drinking the following foods and beverages that may increase nausea or further loss of fluid:  Fruit juices with a high sugar content, such as concentrated juices. Alcohol. Beverages containing caffeine. Carbonated drinks. They may cause a lot of gas. Foods that may cause a lot of gas, such as cabbage, broccoli, and beans. Fatty, greasy, and fried foods. Spicy, very salty, and very sweet foods or drinks. Foods or drinks that are very hot or very cold. Consume food or drinks at or near room temperature. Foods that need a lot of chewing, such as raw vegetables. Foods that are sticky or hard to swallow, such as peanut butter. Document Released: 03/02/2012 Document Revised: 09/02/2012 Document Reviewed: 03/02/2012 [facility] Patient Information 2015 [place], [place]. This information is not intended to replace advice given to you by your health care provider. Make sure you discuss any questions you have with your health care provider.

## 2017-08-27 NOTE — ED Provider Notes (Signed)
Emergency Department Provider Note   I have reviewed the triage vital signs and the nursing notes.   HISTORY  Chief Complaint Dizziness   HPI Daniel Chavez is a 41 y.o. male with PMH of MS currently on steroid presents to the emergency department for evaluation of abnormal vital signs at the primary care physician. The patient reports 3 weeks of feeling off balance. He denies spinning sensation but states it feels like he is moving and shifting. Symptoms improve when he lays flat. Denies fevers or chills. No ear pain. He was diagnosed with MS in 2008 with few flares until this summer when he has been on steroids several times. He is following with his neurology team and had an MRI in 07/2017. He was also started on Wellbutrin and Provigil recently with fatigue and depression symptoms.   The patient's wife states that they went to the walk-in clinic today to make sure he didn't have an inner ear issue. At that time they performed an EKG and found the patient to be tachycardic. He apparently had abnormal orthostatic vital signs. He states he's been eating and drinking well. Denies any vomiting or diarrhea. They called the neurologist who started him on prednisone today with concern for other MS symptoms and return of MS flare. He took the first 60 mg does this afternoon.   He denies any chest pain or shortness of breath. No obvious blood loss.   Past Medical History:  Diagnosis Date  . MS (multiple sclerosis) ([place])   . Multiple sclerosis ([place]) 09/22/2013  . Obesity   . Optic neuritis, left     Patient Active Problem List   Diagnosis Date Noted  . Multiple sclerosis ([place]) 09/22/2013    Past Surgical History:  Procedure Laterality Date  . HERNIA REPAIR     2005/2007 Left siide    Current Outpatient Rx  . Order #: [id number] Class: Print  . Order #: [id number] Class: Normal  . Order #: [id number] Class: Print  . Order #: [id number] Class: Print  . Order #: [id number] Class: Print  .  Order #: [id number] Class: Historical Med  . Order #: [id number] Class: Normal    Allergies Patient has no known allergies.  Family History  Problem Relation Age of Onset  . Leukemia Sister     Social History Social History  Substance Use Topics  . Smoking status: Former Research scientist (life sciences)  . Smokeless tobacco: Never Used     Comment: quit 03/2006  . Alcohol use No    Review of Systems  Constitutional: No fever/chills. Positive fatigue.  Eyes: No visual changes. ENT: No sore throat.  Cardiovascular: Denies chest pain. Respiratory: Denies shortness of breath. Gastrointestinal: No abdominal pain.  No nausea, no vomiting.  No diarrhea.  No constipation. Genitourinary: Negative for dysuria. Musculoskeletal: Negative for back pain. Skin: Negative for rash. Neurological: Negative for headaches, focal weakness or numbness. Positive feeling off balance.    10-point ROS otherwise negative.  ____________________________________________   PHYSICAL EXAM:  VITAL SIGNS: ED Triage Vitals  Enc Vitals Group     BP 08/27/17 1516 (!) 148/102     Pulse Rate 08/27/17 1516 (!) 121     Resp 08/27/17 1516 17     Temp 08/27/17 1516 98.3 F (36.8 C)     Temp Source 08/27/17 1516 Oral     SpO2 08/27/17 1516 94 %     Weight 08/27/17 1516 239 lb (108.4 kg)     Height 08/27/17 1516 6' 1"  (1.854 m)     Pain  Score 08/27/17 1536 7   Constitutional: Alert and oriented. Well appearing and in no acute distress. Eyes: Conjunctivae are normal. PERRL. EOMI. Head: Atraumatic. Ears:  Healthy appearing ear canals and TMs bilaterally Nose: No congestion/rhinnorhea. Mouth/Throat: Mucous membranes are dry.  Neck: No stridor.  No meningeal signs.  Cardiovascular: Sinus tachycardia. Good peripheral circulation. Grossly normal heart sounds.   Respiratory: Normal respiratory effort.  No retractions. Lungs CTAB. Gastrointestinal: Soft and nontender. No distention.  Musculoskeletal: No lower extremity tenderness nor  edema. No gross deformities of extremities. Neurologic:  Normal speech and language. No gross focal neurologic deficits are appreciated. Normal finger-to-nose testing. Normal CN exam 2-12. No pronator drift. Steady gait.  Skin:  Skin is warm, dry and intact. No rash noted.  ____________________________________________   LABS (all labs ordered are listed, but only abnormal results are displayed)  Labs Reviewed  COMPREHENSIVE METABOLIC PANEL - Abnormal; Notable for the following:       Result Value   Glucose, Bld 113 (*)    All other components within normal limits  CBC WITH DIFFERENTIAL/PLATELET - Abnormal; Notable for the following:    Lymphs Abs 0.5 (*)    All other components within normal limits  I-STAT TROPONIN, ED    ____________________________________________   PROCEDURES  Procedure(s) performed:   Procedures  None ____________________________________________   INITIAL IMPRESSION / ASSESSMENT AND PLAN / ED COURSE  Pertinent labs & imaging results that were available during my care of the patient were reviewed by me and considered in my medical decision making (see chart for details).  Patient presents to the emergency pertinent for evaluation of tachycardia and orthostatic vital signs in the setting of feeling off balance for the past 3 weeks. MRI from mid-August shows lesions in the brainstem, cerebellum, thalamus. Question if this off-balance sensation is due to MS flare (for which the patient is currently being treated with steroid), polypharmacy, dehydration. Plan for lab work, IV fluids, reassessment.   Vitals signs and symptoms improving rapidly with Meclizine and IVF. Patient ambulatory to the bathroom without difficulty. Will follow with PCP and Neurology in the coming days to discuss treatment and any possible drug-drug interactions.   At this time, I do not feel there is any life-threatening condition present. I have reviewed and discussed all results (EKG,  imaging, lab, urine as appropriate), exam findings with patient. I have reviewed nursing notes and appropriate previous records.  I feel the patient is safe to be discharged home without further emergent workup. Discussed usual and customary return precautions. Patient and family (if present) verbalize understanding and are comfortable with this plan.  Patient will follow-up with their primary care provider. If they do not have a primary care provider, information for follow-up has been provided to them. All questions have been answered.  ____________________________________________  FINAL CLINICAL IMPRESSION(S) / ED DIAGNOSES  Final diagnoses:  Lightheadedness  Dehydration     MEDICATIONS GIVEN DURING THIS VISIT:  Medications  sodium chloride 0.9 % bolus 1,000 mL (0 mLs Intravenous Stopped 08/27/17 1933)  sodium chloride 0.9 % bolus 1,000 mL (0 mLs Intravenous Stopped 08/27/17 2141)  meclizine (ANTIVERT) tablet 12.5 mg (12.5 mg Oral Given 08/27/17 2025)     NEW OUTPATIENT MEDICATIONS STARTED DURING THIS VISIT:  Discharge Medication List as of 08/27/2017 10:13 PM    START taking these medications   Details  meclizine (ANTIVERT) 12.5 MG tablet Take 1 tablet (12.5 mg total) by mouth 3 (three) times daily as needed for dizziness., Starting Wed 08/27/2017,  Print        Note:  This document was prepared using Dragon voice recognition software and may include unintentional dictation errors.  Nanda Quinton, MD Emergency Medicine    Kortnee Bas, [facility], MD 08/27/17 [phone]

## 2017-08-27 NOTE — ED Triage Notes (Signed)
Pt was sent from [facility] in for further evaluation of tachycardia and dizziness

## 2017-08-28 NOTE — Telephone Encounter (Signed)
Spoke with patient in reference to disability Daniel Chavez forms advised patient forms have been completed and will be faxed to [phone].  Patient paid $50 fee on 08/18/17.

## 2017-09-02 ENCOUNTER — Ambulatory Visit
Admission: RE | Admit: 2017-09-02 | Discharge: 2017-09-02 | Disposition: A | Discharge status: Home / Self Care | Payer: Managed Care, Other (non HMO) | Source: Ambulatory Visit | Attending: General Surgery | Admitting: General Surgery

## 2017-09-02 DIAGNOSIS — K432 Incisional hernia without obstruction or gangrene: Secondary | ICD-10-CM

## 2017-09-02 MED ORDER — IOPAMIDOL (ISOVUE-300) INJECTION 61%
100.0000 mL | Freq: Once | INTRAVENOUS | Status: AC | PRN
  Administered 2017-09-02: 100 mL via INTRAVENOUS

## 2017-09-03 NOTE — Telephone Encounter (Signed)
Received form from [facility] for questions clarifying disability claim. Faxed completed/signed form back to [place] at [phone]. Received confirmation.

## 2017-09-04 ENCOUNTER — Telehealth: Payer: Self-pay | Admitting: Neurology

## 2017-09-04 NOTE — Telephone Encounter (Signed)
I got a call from Dr. Gilford Silvius from [facility] surgery. The patient is contemplating hernia surgery, I have see no neurologic contraindications for the surgery.

## 2017-09-09 ENCOUNTER — Encounter: Payer: Self-pay | Admitting: *Deleted

## 2017-09-10 ENCOUNTER — Telehealth: Payer: Self-pay | Admitting: Neurology

## 2017-09-10 MED ORDER — ONDANSETRON HCL 4 MG PO TABS: 4.0000 mg | ORAL_TABLET | Freq: Three times a day (TID) | ORAL | 0 refills | Status: DC | PRN

## 2017-09-10 NOTE — Addendum Note (Signed)
Addended by: Kathrynn Ducking on: 09/10/2017 11:53 AM   Modules accepted: Orders

## 2017-09-10 NOTE — Telephone Encounter (Signed)
Patient calling asking how long can he expect to have flu like symptoms after Tysabri and can he have surgery?

## 2017-09-10 NOTE — Telephone Encounter (Signed)
I called patient. The patient had a Tysabri infusion 2 days ago, he is having symptoms of fatigue, malaise, and nausea. These side effects are reported on one Tysabri. He denies any fevers or chills. No rash noted.  I will send in a prescription for Zofran for the nausea.

## 2017-09-11 ENCOUNTER — Encounter: Payer: Self-pay | Admitting: Neurology

## 2017-09-11 NOTE — Telephone Encounter (Signed)
I called the wife. The patient still having malaise and nausea following the Tysabri infusion. If this persists for several more days, the patient will need to come in for blood work.  The short-term disability was denied as they do not have enough information. I will dictate a letter indicating the reason for short-term disability.

## 2017-09-11 NOTE — Telephone Encounter (Signed)
Faxed letter to [phone] as requested per CW,MD. Incident # [id number]. Received fax confirmation.

## 2017-09-11 NOTE — Telephone Encounter (Addendum)
Pt's wife called in he has been feeling poorly since infusion on Monday. Please call her at [phone] prior to 11 if possible. She will have meetings this afternoon but her husband will be available this afternoon She also said his short term disability was denied due to lack of information. He has been without pay for 1 mth now.

## 2017-09-15 ENCOUNTER — Telehealth: Payer: Self-pay | Admitting: Neurology

## 2017-09-15 MED ORDER — MECLIZINE HCL 25 MG PO TABS: 25.0000 mg | ORAL_TABLET | Freq: Three times a day (TID) | ORAL | 3 refills | Status: DC | PRN

## 2017-09-15 NOTE — Addendum Note (Signed)
Addended by: Kathrynn Ducking on: 09/15/2017 01:02 PM   Modules accepted: Orders

## 2017-09-15 NOTE — Telephone Encounter (Signed)
I called patient. He is now feeling back to his usual baseline, but he does have baseline problems with dizziness that has been improved with meclizine, I will call in a prescription.  The fatigue and nausea have improved, he feels like he did before the Tysabri injection.

## 2017-09-15 NOTE — Telephone Encounter (Signed)
Patient wants to know if more blood work is needed. For the past 2 weeks his legs are very weak and he is dizzy.

## 2017-09-17 ENCOUNTER — Encounter: Payer: Self-pay | Admitting: Neurology

## 2017-09-17 NOTE — Telephone Encounter (Signed)
The patient gets a total of 10 weeks of disability, I believe that I gave him 8 weeks, I will try to write a letter to extended out to the full 10 weeks. The patient may be going for hernia surgery in the near future.  This starting period Would be 08/11/2017, we will extend out 10 weeks from there.

## 2017-09-17 NOTE — Telephone Encounter (Signed)
Pt is asking for a call back to know if Dr Jannifer Franklin is able to extend his short term disability.  Please call

## 2017-09-17 NOTE — Telephone Encounter (Signed)
Faxed addended disability medical request form back to [place]. Re: extending STD to 10/20/17. Also included letter from Dr Jannifer Franklin. Fax: [phone]. Received fax confirmation.

## 2017-09-24 ENCOUNTER — Telehealth: Payer: Self-pay | Admitting: Neurology

## 2017-09-24 NOTE — Telephone Encounter (Signed)
Called and spoke with wife, Anderson Malta. Pt not available at time of call. She stated [employer] requesting more information before submitting STD claim. Wanting physical exam/functionality included. They are requesting what sx patient is still experiencing. Also requesting IV documentation when pt in office with intrafusion. Also times he received oral prednisone.  Spoke with [name] in intrafusion at time of call. Advised next infusion appt 10/03/17 at [place] also spoke with wife. She will call wife back re: billing questions.

## 2017-09-24 NOTE — Telephone Encounter (Signed)
Pt called said [employer] notified him claim was not filled out correctly. Pt has the information on the claim that was needs to be corrected by declined to give it me saying it was too much to go over and wanted to speak with RN.

## 2017-09-25 ENCOUNTER — Encounter: Payer: Self-pay | Admitting: Neurology

## 2017-09-25 ENCOUNTER — Ambulatory Visit: Payer: Self-pay | Admitting: General Surgery

## 2017-09-25 NOTE — Telephone Encounter (Signed)
Faxed letter to Gershon CraneElmyra Ricks at [phone]. Received fax confirmation.

## 2017-09-25 NOTE — Telephone Encounter (Signed)
I called and talked with the wife. The patient is having difficulty getting short-term disability through [place]. The insurance company wishes to have more detailed information, I will try dictating another note. This is being handled by a person by the name of the cold, her telephone number is [phone].  Fax number is [phone].

## 2017-09-25 NOTE — Telephone Encounter (Signed)
error 

## 2017-10-15 ENCOUNTER — Encounter (HOSPITAL_COMMUNITY): Payer: Self-pay

## 2017-10-15 ENCOUNTER — Encounter (HOSPITAL_COMMUNITY)
Admission: RE | Admit: 2017-10-15 | Discharge: 2017-10-15 | Disposition: A | Discharge status: Home / Self Care | Payer: Managed Care, Other (non HMO) | Source: Ambulatory Visit | Attending: General Surgery | Admitting: General Surgery

## 2017-10-15 DIAGNOSIS — Z01812 Encounter for preprocedural laboratory examination: Secondary | ICD-10-CM | POA: Diagnosis not present

## 2017-10-15 DIAGNOSIS — R42 Dizziness and giddiness: Secondary | ICD-10-CM | POA: Insufficient documentation

## 2017-10-15 DIAGNOSIS — K432 Incisional hernia without obstruction or gangrene: Secondary | ICD-10-CM | POA: Diagnosis not present

## 2017-10-15 DIAGNOSIS — R000 Tachycardia, unspecified: Secondary | ICD-10-CM | POA: Diagnosis not present

## 2017-10-15 HISTORY — DX: Personal history of other diseases of the digestive system: Z87.19

## 2017-10-15 LAB — BASIC METABOLIC PANEL
Anion gap: 9 (ref 5–15)
BUN: 19 mg/dL (ref 6–20)
CO2: 27 mmol/L (ref 22–32)
Calcium: 9.8 mg/dL (ref 8.9–10.3)
Chloride: 105 mmol/L (ref 101–111)
Creatinine, Ser: 0.93 mg/dL (ref 0.61–1.24)
GFR calc Af Amer: >60 mL/min (ref >60)
GFR calc non Af Amer: >60 mL/min (ref >60)
Glucose, Bld: 88 mg/dL (ref 65–99)
Potassium: 4.3 mmol/L (ref 3.5–5.1)
Sodium: 141 mmol/L (ref 135–145)

## 2017-10-15 LAB — CBC
HCT: 43.1 % (ref 39.0–52.0)
Hemoglobin: 14.7 g/dL (ref 13.0–17.0)
MCH: 30.6 pg (ref 26.0–34.0)
MCHC: 34.1 g/dL (ref 30.0–36.0)
MCV: 89.8 fL (ref 78.0–100.0)
Platelets: 241 10*3/uL (ref 150–400)
RBC: 4.8 MIL/uL (ref 4.22–5.81)
RDW: 13.3 % (ref 11.5–15.5)
WBC: 9.2 10*3/uL (ref 4.0–10.5)

## 2017-10-15 NOTE — Pre-Procedure Instructions (Addendum)
Daniel Chavez  10/15/2017      [facility] [address], [place] [address] [place] [address] Phone: [phone] Fax: [phone]    Your procedure is scheduled on 10/20/2017  Report to [facility] Admitting at 11:00 A.M.  Call this number if you have problems the morning of surgery:  [phone]   Remember:  Do not eat food or drink liquids after midnight.  On SUNDAY NIGHT     With the exception of the special Boost Breeze juice box- to be fully finished drinking by 9: 00 AM on  Monday morning              IN ADDITION FOLLOW THE BOWEL PREP INSTRUCTIONS GIVEN TO YOU BY DR. Holland office   Take these medicines the morning of surgery with A SIP OF WATER: if needed take Xanax  STOP all herbel meds, nsaids (aleve,naproxen,advil,ibuprofen) Starting today 10/15/17 including all vitamins/supplements,aspirin  Do not wear jewelry, lotions, powders, or perfumes, or deodorant.  Men may shave face and neck.Do not bring valuables to the hospital.   [facility] is not responsible for any belongings or valuables.  Contacts, dentures or bridgework may not be worn into surgery.  Leave your suitcase in the car.  After surgery it may be brought to your room.  For patients admitted to the hospital, discharge time will be determined by your treatment team.  Patients discharged the day of surgery will not be allowed to drive home.   Name and phone number of your driver:    Special instructions:  Special Instructions: [facility] - Preparing for Surgery  Before surgery, you can play an important role.  Because skin is not sterile, your skin needs to be as free of germs as possible.  You can reduce the number of germs on you skin by washing with CHG (chlorahexidine gluconate) soap before surgery.  CHG is an antiseptic cleaner which kills germs and bonds with the skin to continue killing germs even after  washing.  Please DO NOT use if you have an allergy to CHG or antibacterial soaps.  If your skin becomes reddened/irritated stop using the CHG and inform your nurse when you arrive at Short Stay.  Do not shave (including legs and underarms) for at least 48 hours prior to the first CHG shower.  You may shave your face.  Please follow these instructions carefully:   1.  Shower with CHG Soap the night before surgery and the  morning of Surgery.  2.  If you choose to wash your hair, wash your hair first as usual with your  normal shampoo.  3.  After you shampoo, rinse your hair and body thoroughly to remove the  Shampoo.  4.  Use CHG as you would any other liquid soap.  You can apply chg directly to the skin and wash gently with scrungie or a clean washcloth.  5.  Apply the CHG Soap to your body ONLY FROM THE NECK DOWN.    Do not use on open wounds or open sores.  Avoid contact with your eyes, ears, mouth and genitals (private parts).  Wash genitals (private parts)   with your normal soap.  6.  Wash thoroughly, paying special attention to the area where your surgery will be performed.  7.  Thoroughly rinse your body with warm water from the neck down.  8.  DO NOT shower/wash with your normal soap  after using and rinsing off   the CHG Soap.  9.  Pat yourself dry with a clean towel.            10.  Wear clean pajamas.            11.  Place clean sheets on your bed the night of your first shower and do not sleep with pets.  Day of Surgery  Do not apply any lotions/deodorants the morning of surgery.  Please wear clean clothes to the hospital/surgery center.  Please read over the  fact sheets that you were given.

## 2017-10-15 NOTE — Progress Notes (Signed)
Daniel Chavez at office called re: bowel prep- patient unaware-   Daniel Chavez to call patient with instructions if needed.

## 2017-10-16 NOTE — Progress Notes (Signed)
Anesthesia Chart Review:  Pt is a 41 year old male scheduled for repair of recurrent incisional hernia with mesh on 10/20/2017 with Georganna Skeans, MD  - PCP is Aura Dials, MD - Neurologist is Margette Fast, MD who cleared pt for surgery.   PMH includes:  MS. Former smoker. BMI 34  - ED visit 08/27/17 for tachycardia and dizziness. Sx improved with IVF and meclizine.   Medications reviewed.   BP 133/86   Pulse 96   Temp 36.8 C   Resp 20   Ht 6' 1"  (1.854 m)   Wt 257 lb 6.4 oz (116.8 kg)   SpO2 95%   BMI 33.96 kg/m    Preoperative labs reviewed.    EKG 08/27/17: Sinus tachycardia (124 bpm).   If no changes, I anticipate pt can proceed with surgery as scheduled.   Willeen Cass, FNP-BC [facility] Short Stay Surgical Center/Anesthesiology Phone: [phone] 10/16/2017 12:23 PM

## 2017-10-20 ENCOUNTER — Inpatient Hospital Stay (HOSPITAL_COMMUNITY)
Admission: RE | Admit: 2017-10-20 | Discharge: 2017-10-26 | DRG: 336 | Disposition: A | Discharge status: Home / Self Care | Payer: Managed Care, Other (non HMO) | Source: Ambulatory Visit | Attending: General Surgery | Admitting: General Surgery

## 2017-10-20 ENCOUNTER — Ambulatory Visit (HOSPITAL_COMMUNITY): Payer: Managed Care, Other (non HMO) | Admitting: Anesthesiology

## 2017-10-20 ENCOUNTER — Encounter (HOSPITAL_COMMUNITY)
Admission: RE | Disposition: A | Discharge status: Home / Self Care | Payer: Self-pay | Source: Ambulatory Visit | Attending: General Surgery

## 2017-10-20 ENCOUNTER — Ambulatory Visit (HOSPITAL_COMMUNITY): Payer: Managed Care, Other (non HMO) | Admitting: Emergency Medicine

## 2017-10-20 ENCOUNTER — Encounter (HOSPITAL_COMMUNITY): Payer: Self-pay | Admitting: Anesthesiology

## 2017-10-20 DIAGNOSIS — G35 Multiple sclerosis: Secondary | ICD-10-CM | POA: Diagnosis present

## 2017-10-20 DIAGNOSIS — Z23 Encounter for immunization: Secondary | ICD-10-CM

## 2017-10-20 DIAGNOSIS — K430 Incisional hernia with obstruction, without gangrene: Principal | ICD-10-CM | POA: Diagnosis present

## 2017-10-20 DIAGNOSIS — K567 Ileus, unspecified: Secondary | ICD-10-CM | POA: Diagnosis not present

## 2017-10-20 DIAGNOSIS — Z87891 Personal history of nicotine dependence: Secondary | ICD-10-CM

## 2017-10-20 DIAGNOSIS — E669 Obesity, unspecified: Secondary | ICD-10-CM | POA: Diagnosis present

## 2017-10-20 DIAGNOSIS — F419 Anxiety disorder, unspecified: Secondary | ICD-10-CM | POA: Diagnosis present

## 2017-10-20 DIAGNOSIS — Z6831 Body mass index (BMI) 31.0-31.9, adult: Secondary | ICD-10-CM

## 2017-10-20 DIAGNOSIS — K660 Peritoneal adhesions (postprocedural) (postinfection): Secondary | ICD-10-CM | POA: Diagnosis present

## 2017-10-20 HISTORY — PX: INCISIONAL HERNIA REPAIR: SHX193

## 2017-10-20 HISTORY — PX: INSERTION OF MESH: SHX5868

## 2017-10-20 LAB — CBC
HCT: 42.3 % (ref 39.0–52.0)
Hemoglobin: 14.1 g/dL (ref 13.0–17.0)
MCH: 30 pg (ref 26.0–34.0)
MCHC: 33.3 g/dL (ref 30.0–36.0)
MCV: 90 fL (ref 78.0–100.0)
Platelets: 225 10*3/uL (ref 150–400)
RBC: 4.7 MIL/uL (ref 4.22–5.81)
RDW: 13.4 % (ref 11.5–15.5)
WBC: 17.9 10*3/uL — ABNORMAL HIGH (ref 4.0–10.5)

## 2017-10-20 LAB — CREATININE, SERUM
Creatinine, Ser: 0.96 mg/dL (ref 0.61–1.24)
GFR calc Af Amer: >60 mL/min (ref >60)
GFR calc non Af Amer: >60 mL/min (ref >60)

## 2017-10-20 SURGERY — REPAIR, HERNIA, INCISIONAL
Anesthesia: General | Site: Abdomen

## 2017-10-20 MED ORDER — PHENYLEPHRINE HCL 10 MG/ML IJ SOLN
INTRAMUSCULAR | Status: DC | PRN
  Administered 2017-10-20: 80 ug via INTRAVENOUS

## 2017-10-20 MED ORDER — HYDROMORPHONE HCL 1 MG/ML IJ SOLN
1.0000 mg | INTRAMUSCULAR | Status: DC | PRN
  Administered 2017-10-20: 2 mg via INTRAVENOUS
  Administered 2017-10-20 – 2017-10-21 (×3): 1 mg via INTRAVENOUS
  Filled 2017-10-20 (×3): qty 1
  Filled 2017-10-20: qty 2

## 2017-10-20 MED ORDER — SUCCINYLCHOLINE CHLORIDE 200 MG/10ML IV SOSY
PREFILLED_SYRINGE | INTRAVENOUS | Status: AC
  Filled 2017-10-20: qty 10

## 2017-10-20 MED ORDER — PROPOFOL 10 MG/ML IV BOLUS
INTRAVENOUS | Status: DC | PRN
Start: 2017-10-20 — End: 2017-10-20
  Administered 2017-10-20: 200 mg via INTRAVENOUS

## 2017-10-20 MED ORDER — ONDANSETRON HCL 4 MG/2ML IJ SOLN
INTRAMUSCULAR | Status: AC
  Filled 2017-10-20: qty 2

## 2017-10-20 MED ORDER — MIDAZOLAM HCL 5 MG/5ML IJ SOLN
INTRAMUSCULAR | Status: DC | PRN
  Administered 2017-10-20: 2 mg via INTRAVENOUS

## 2017-10-20 MED ORDER — KCL IN DEXTROSE-NACL 20-5-0.45 MEQ/L-%-% IV SOLN
INTRAVENOUS | Status: DC
  Administered 2017-10-20: 18:00:00 via INTRAVENOUS
  Administered 2017-10-21: 1 mL via INTRAVENOUS
  Administered 2017-10-22 – 2017-10-24 (×4): via INTRAVENOUS
  Filled 2017-10-20 (×9): qty 1000

## 2017-10-20 MED ORDER — PANTOPRAZOLE SODIUM 40 MG IV SOLR
40.0000 mg | Freq: Every day | INTRAVENOUS | Status: DC
  Administered 2017-10-20 – 2017-10-25 (×6): 40 mg via INTRAVENOUS
  Filled 2017-10-20 (×6): qty 40

## 2017-10-20 MED ORDER — CHLORHEXIDINE GLUCONATE CLOTH 2 % EX PADS: 6.0000 | MEDICATED_PAD | Freq: Once | CUTANEOUS | Status: DC

## 2017-10-20 MED ORDER — MECLIZINE HCL 25 MG PO TABS
25.0000 mg | ORAL_TABLET | Freq: Three times a day (TID) | ORAL | Status: DC | PRN
  Filled 2017-10-20: qty 1

## 2017-10-20 MED ORDER — OXYCODONE HCL 5 MG PO TABS: 5.0000 mg | ORAL_TABLET | Freq: Once | ORAL | Status: DC | PRN

## 2017-10-20 MED ORDER — DOCUSATE SODIUM 100 MG PO CAPS
100.0000 mg | ORAL_CAPSULE | Freq: Two times a day (BID) | ORAL | Status: DC
  Administered 2017-10-21 – 2017-10-26 (×11): 100 mg via ORAL
  Filled 2017-10-20 (×12): qty 1

## 2017-10-20 MED ORDER — 0.9 % SODIUM CHLORIDE (POUR BTL) OPTIME
TOPICAL | Status: DC | PRN
  Administered 2017-10-20 (×2): 1000 mL

## 2017-10-20 MED ORDER — PROPOFOL 10 MG/ML IV BOLUS
INTRAVENOUS | Status: AC
  Filled 2017-10-20: qty 20

## 2017-10-20 MED ORDER — ONDANSETRON HCL 4 MG PO TABS
4.0000 mg | ORAL_TABLET | Freq: Three times a day (TID) | ORAL | Status: DC | PRN
  Administered 2017-10-23 (×2): 4 mg via ORAL
  Filled 2017-10-20 (×2): qty 1

## 2017-10-20 MED ORDER — PHENYLEPHRINE HCL 10 MG/ML IJ SOLN
INTRAVENOUS | Status: DC | PRN
  Administered 2017-10-20: 50 ug/min via INTRAVENOUS

## 2017-10-20 MED ORDER — PROMETHAZINE HCL 25 MG/ML IJ SOLN: 6.2500 mg | INTRAMUSCULAR | Status: DC | PRN

## 2017-10-20 MED ORDER — OXYCODONE HCL 5 MG PO TABS
ORAL_TABLET | ORAL | Status: AC
  Filled 2017-10-20: qty 2

## 2017-10-20 MED ORDER — HYDRALAZINE HCL 20 MG/ML IJ SOLN: 10.0000 mg | INTRAMUSCULAR | Status: DC | PRN

## 2017-10-20 MED ORDER — FENTANYL CITRATE (PF) 250 MCG/5ML IJ SOLN
INTRAMUSCULAR | Status: AC
  Filled 2017-10-20: qty 5

## 2017-10-20 MED ORDER — INFLUENZA VAC SPLIT QUAD 0.5 ML IM SUSY
0.5000 mL | PREFILLED_SYRINGE | INTRAMUSCULAR | Status: AC
  Administered 2017-10-21: 0.5 mL via INTRAMUSCULAR
  Filled 2017-10-20: qty 0.5

## 2017-10-20 MED ORDER — DEXAMETHASONE SODIUM PHOSPHATE 4 MG/ML IJ SOLN
INTRAMUSCULAR | Status: DC | PRN
  Administered 2017-10-20: 10 mg via INTRAVENOUS

## 2017-10-20 MED ORDER — LIDOCAINE 2% (20 MG/ML) 5 ML SYRINGE
INTRAMUSCULAR | Status: AC
  Filled 2017-10-20: qty 5

## 2017-10-20 MED ORDER — HYDROMORPHONE HCL 1 MG/ML IJ SOLN
INTRAMUSCULAR | Status: AC
  Administered 2017-10-20: 0.5 mg via INTRAVENOUS
  Filled 2017-10-20: qty 1

## 2017-10-20 MED ORDER — ACETAMINOPHEN 500 MG PO TABS
1000.0000 mg | ORAL_TABLET | ORAL | Status: AC
  Administered 2017-10-20: 1000 mg via ORAL

## 2017-10-20 MED ORDER — DEXAMETHASONE SODIUM PHOSPHATE 10 MG/ML IJ SOLN
INTRAMUSCULAR | Status: AC
  Filled 2017-10-20: qty 1

## 2017-10-20 MED ORDER — MIDAZOLAM HCL 2 MG/2ML IJ SOLN
INTRAMUSCULAR | Status: AC
  Filled 2017-10-20: qty 2

## 2017-10-20 MED ORDER — LACTATED RINGERS IV SOLN
INTRAVENOUS | Status: DC
  Administered 2017-10-20: 50 mL/h via INTRAVENOUS
  Administered 2017-10-20: 14:00:00 via INTRAVENOUS

## 2017-10-20 MED ORDER — ENOXAPARIN SODIUM 40 MG/0.4ML ~~LOC~~ SOLN
40.0000 mg | SUBCUTANEOUS | Status: DC
  Administered 2017-10-21 – 2017-10-26 (×6): 40 mg via SUBCUTANEOUS
  Filled 2017-10-20 (×6): qty 0.4

## 2017-10-20 MED ORDER — DEXTROSE 5 % IV SOLN
1000.0000 mg | Freq: Three times a day (TID) | INTRAVENOUS | Status: DC | PRN
  Administered 2017-10-21 – 2017-10-22 (×3): 1000 mg via INTRAVENOUS
  Filled 2017-10-20 (×5): qty 10

## 2017-10-20 MED ORDER — KETOROLAC TROMETHAMINE 15 MG/ML IJ SOLN
15.0000 mg | Freq: Four times a day (QID) | INTRAMUSCULAR | Status: AC | PRN
  Administered 2017-10-20 – 2017-10-22 (×4): 15 mg via INTRAVENOUS
  Filled 2017-10-20 (×4): qty 1

## 2017-10-20 MED ORDER — DIPHENHYDRAMINE HCL 50 MG/ML IJ SOLN
25.0000 mg | Freq: Four times a day (QID) | INTRAMUSCULAR | Status: DC | PRN
  Administered 2017-10-20 – 2017-10-21 (×2): 25 mg via INTRAVENOUS
  Filled 2017-10-20 (×2): qty 1

## 2017-10-20 MED ORDER — HYDROMORPHONE HCL 1 MG/ML IJ SOLN
0.2500 mg | INTRAMUSCULAR | Status: DC | PRN
  Administered 2017-10-20 (×4): 0.5 mg via INTRAVENOUS

## 2017-10-20 MED ORDER — CEFAZOLIN SODIUM-DEXTROSE 2-4 GM/100ML-% IV SOLN
2.0000 g | INTRAVENOUS | Status: AC
  Administered 2017-10-20: 2 g via INTRAVENOUS

## 2017-10-20 MED ORDER — ROCURONIUM BROMIDE 100 MG/10ML IV SOLN
INTRAVENOUS | Status: DC | PRN
  Administered 2017-10-20: 40 mg via INTRAVENOUS
  Administered 2017-10-20: 20 mg via INTRAVENOUS
  Administered 2017-10-20: 10 mg via INTRAVENOUS
  Administered 2017-10-20: 20 mg via INTRAVENOUS
  Administered 2017-10-20: 10 mg via INTRAVENOUS
  Administered 2017-10-20: 20 mg via INTRAVENOUS

## 2017-10-20 MED ORDER — TRAMADOL HCL 50 MG PO TABS
50.0000 mg | ORAL_TABLET | Freq: Four times a day (QID) | ORAL | Status: DC | PRN
  Filled 2017-10-20: qty 1

## 2017-10-20 MED ORDER — CELECOXIB 200 MG PO CAPS
200.0000 mg | ORAL_CAPSULE | ORAL | Status: AC
  Administered 2017-10-20: 200 mg via ORAL

## 2017-10-20 MED ORDER — KETOROLAC TROMETHAMINE 15 MG/ML IJ SOLN
15.0000 mg | Freq: Four times a day (QID) | INTRAMUSCULAR | Status: AC
  Administered 2017-10-20: 15 mg via INTRAVENOUS
  Filled 2017-10-20: qty 1

## 2017-10-20 MED ORDER — OXYCODONE HCL 5 MG/5ML PO SOLN: 5.0000 mg | Freq: Once | ORAL | Status: DC | PRN

## 2017-10-20 MED ORDER — DIPHENHYDRAMINE HCL 25 MG PO CAPS: 25.0000 mg | ORAL_CAPSULE | Freq: Four times a day (QID) | ORAL | Status: DC | PRN

## 2017-10-20 MED ORDER — CEFAZOLIN SODIUM-DEXTROSE 2-4 GM/100ML-% IV SOLN
INTRAVENOUS | Status: AC
  Filled 2017-10-20: qty 100

## 2017-10-20 MED ORDER — ORAL CARE MOUTH RINSE
15.0000 mL | Freq: Two times a day (BID) | OROMUCOSAL | Status: DC
  Administered 2017-10-21 – 2017-10-25 (×7): 15 mL via OROMUCOSAL

## 2017-10-20 MED ORDER — FENTANYL CITRATE (PF) 100 MCG/2ML IJ SOLN
INTRAMUSCULAR | Status: DC | PRN
  Administered 2017-10-20: 50 ug via INTRAVENOUS
  Administered 2017-10-20: 100 ug via INTRAVENOUS
  Administered 2017-10-20 (×2): 50 ug via INTRAVENOUS

## 2017-10-20 MED ORDER — BUPIVACAINE-EPINEPHRINE 0.25% -1:200000 IJ SOLN
INTRAMUSCULAR | Status: DC | PRN
  Administered 2017-10-20: 20 mL

## 2017-10-20 MED ORDER — OXYCODONE HCL 5 MG PO TABS
5.0000 mg | ORAL_TABLET | ORAL | Status: DC | PRN
  Administered 2017-10-20 – 2017-10-21 (×2): 10 mg via ORAL
  Filled 2017-10-20: qty 2

## 2017-10-20 MED ORDER — IBUPROFEN 400 MG PO TABS
400.0000 mg | ORAL_TABLET | Freq: Three times a day (TID) | ORAL | Status: DC | PRN
  Administered 2017-10-24: 400 mg via ORAL
  Filled 2017-10-20: qty 1

## 2017-10-20 MED ORDER — ALPRAZOLAM 0.5 MG PO TABS
0.5000 mg | ORAL_TABLET | Freq: Every evening | ORAL | Status: DC | PRN
  Administered 2017-10-22 – 2017-10-25 (×4): 0.5 mg via ORAL
  Filled 2017-10-20 (×6): qty 1

## 2017-10-20 MED ORDER — BUPIVACAINE-EPINEPHRINE (PF) 0.25% -1:200000 IJ SOLN
INTRAMUSCULAR | Status: AC
  Filled 2017-10-20: qty 30

## 2017-10-20 MED ORDER — LIDOCAINE 2% (20 MG/ML) 5 ML SYRINGE
INTRAMUSCULAR | Status: DC | PRN
  Administered 2017-10-20: 80 mg via INTRAVENOUS

## 2017-10-20 MED ORDER — SUGAMMADEX SODIUM 200 MG/2ML IV SOLN
INTRAVENOUS | Status: DC | PRN
  Administered 2017-10-20: 300 mg via INTRAVENOUS

## 2017-10-20 MED ORDER — CELECOXIB 200 MG PO CAPS
ORAL_CAPSULE | ORAL | Status: AC
  Administered 2017-10-20: 200 mg via ORAL
  Filled 2017-10-20: qty 1

## 2017-10-20 MED ORDER — ACETAMINOPHEN 500 MG PO TABS
ORAL_TABLET | ORAL | Status: AC
  Administered 2017-10-20: 1000 mg via ORAL
  Filled 2017-10-20: qty 2

## 2017-10-20 SURGICAL SUPPLY — 44 items
BLADE CLIPPER SURG (BLADE) IMPLANT
CANISTER SUCT 3000ML PPV (MISCELLANEOUS) ×2 IMPLANT
CHLORAPREP W/TINT 26ML (MISCELLANEOUS) ×2 IMPLANT
COVER SURGICAL LIGHT HANDLE (MISCELLANEOUS) ×2 IMPLANT
DERMABOND ADVANCED (GAUZE/BANDAGES/DRESSINGS)
DERMABOND ADVANCED .7 DNX12 (GAUZE/BANDAGES/DRESSINGS) IMPLANT
DRAPE LAPAROSCOPIC ABDOMINAL (DRAPES) ×2 IMPLANT
DRAPE UTILITY XL STRL (DRAPES) ×4 IMPLANT
DRSG OPSITE POSTOP 4X10 (GAUZE/BANDAGES/DRESSINGS) ×2 IMPLANT
DRSG OPSITE POSTOP 4X6 (GAUZE/BANDAGES/DRESSINGS) ×2 IMPLANT
ELECT CAUTERY BLADE 6.4 (BLADE) ×2 IMPLANT
ELECT REM PT RETURN 9FT ADLT (ELECTROSURGICAL) ×2
ELECTRODE REM PT RTRN 9FT ADLT (ELECTROSURGICAL) ×1 IMPLANT
GAUZE SPONGE 4X4 12PLY STRL (GAUZE/BANDAGES/DRESSINGS) IMPLANT
GLOVE BIO SURGEON STRL SZ8 (GLOVE) ×2 IMPLANT
GLOVE BIOGEL PI IND STRL 8 (GLOVE) ×1 IMPLANT
GLOVE BIOGEL PI INDICATOR 8 (GLOVE) ×1
GOWN STRL REUS W/ TWL LRG LVL3 (GOWN DISPOSABLE) ×2 IMPLANT
GOWN STRL REUS W/ TWL XL LVL3 (GOWN DISPOSABLE) ×1 IMPLANT
GOWN STRL REUS W/TWL LRG LVL3 (GOWN DISPOSABLE) ×2
GOWN STRL REUS W/TWL XL LVL3 (GOWN DISPOSABLE) ×1
KIT BASIN OR (CUSTOM PROCEDURE TRAY) ×2 IMPLANT
KIT ROOM TURNOVER OR (KITS) ×2 IMPLANT
LIGASURE IMPACT 36 18CM CVD LR (INSTRUMENTS) ×2 IMPLANT
MARKER SKIN DUAL TIP RULER LAB (MISCELLANEOUS) ×2 IMPLANT
MESH VENT ST 19.6X24.6CM OVL (Mesh General) ×2 IMPLANT
NEEDLE 22X1 1/2 (OR ONLY) (NEEDLE) ×2 IMPLANT
NS IRRIG 1000ML POUR BTL (IV SOLUTION) ×2 IMPLANT
PACK GENERAL/GYN (CUSTOM PROCEDURE TRAY) ×2 IMPLANT
PAD ARMBOARD 7.5X6 YLW CONV (MISCELLANEOUS) ×2 IMPLANT
STAPLER VISISTAT 35W (STAPLE) IMPLANT
SUT ETHILON 2 0 FS 18 (SUTURE) ×4 IMPLANT
SUT MNCRL AB 4-0 PS2 18 (SUTURE) IMPLANT
SUT NOVA 1 T20/GS 25DT (SUTURE) ×12 IMPLANT
SUT NOVA NAB GS-21 0 18 T12 DT (SUTURE) IMPLANT
SUT PROLENE 0 CT 2 (SUTURE) IMPLANT
SUT SILK 2 0 SH CR/8 (SUTURE) ×2 IMPLANT
SUT SILK 3 0 SH CR/8 (SUTURE) ×2 IMPLANT
SUT VIC AB 3-0 SH 27 (SUTURE) ×1
SUT VIC AB 3-0 SH 27X BRD (SUTURE) ×1 IMPLANT
SYR CONTROL 10ML LL (SYRINGE) ×2 IMPLANT
TOWEL GREEN STERILE (TOWEL DISPOSABLE) ×2 IMPLANT
TRAY FOLEY W/METER SILVER 14FR (SET/KITS/TRAYS/PACK) ×2 IMPLANT
WATER STERILE IRR 1000ML POUR (IV SOLUTION) IMPLANT

## 2017-10-20 NOTE — Anesthesia Preprocedure Evaluation (Addendum)
Anesthesia Evaluation  Patient identified by MRN, date of birth, ID band Patient awake    Reviewed: Allergy & Precautions, NPO status , Patient's Chart, lab work & pertinent test results  Airway Mallampati: III  TM Distance: >3 FB Neck ROM: Full    Dental no notable dental hx.    Pulmonary former smoker,    Pulmonary exam normal breath sounds clear to auscultation       Cardiovascular negative cardio ROS Normal cardiovascular exam Rhythm:Regular Rate:Normal     Neuro/Psych Intermittent bilateral leg weakness due to MS   Neuromuscular disease (MS)    GI/Hepatic Neg liver ROS, hiatal hernia,   Endo/Other  negative endocrine ROS  Renal/GU negative Renal ROS  negative genitourinary   Musculoskeletal negative musculoskeletal ROS (+)   Abdominal (+) + obese,   Peds  Hematology negative hematology ROS (+)   Anesthesia Other Findings RECURRENT INCISIONAL HERNIA   Reproductive/Obstetrics                            Anesthesia Physical Anesthesia Plan  ASA: II  Anesthesia Plan: General   Post-op Pain Management:    Induction: Intravenous  PONV Risk Score and Plan: 2 and Ondansetron and Dexamethasone  Airway Management Planned: Oral ETT  Additional Equipment:   Intra-op Plan:   Post-operative Plan: Extubation in OR  Informed Consent: I have reviewed the patients History and Physical, chart, labs and discussed the procedure including the risks, benefits and alternatives for the proposed anesthesia with the patient or authorized representative who has indicated his/her understanding and acceptance.   Dental advisory given  Plan Discussed with: CRNA  Anesthesia Plan Comments:        Anesthesia Quick Evaluation

## 2017-10-20 NOTE — Anesthesia Postprocedure Evaluation (Signed)
Anesthesia Post Note  Patient: Daniel Chavez  Procedure(s) Performed: REPAIR OF RECURRENT INCISIONAL HERNIA WITH MESH (N/A Abdomen) INSERTION OF MESH (N/A Abdomen)     Patient location during evaluation: PACU Anesthesia Type: General Level of consciousness: awake and alert Pain management: pain level controlled Vital Signs Assessment: post-procedure vital signs reviewed and stable Respiratory status: spontaneous breathing, nonlabored ventilation and respiratory function stable Cardiovascular status: blood pressure returned to baseline and stable Postop Assessment: no apparent nausea or vomiting Anesthetic complications: no    Last Vitals:  Vitals:   10/20/17 1530 10/20/17 1545  BP: (!) 126/93 125/80  Pulse: 84 81  Resp: 18 (!) 21  Temp:    SpO2: 96% 95%    Last Pain:  Vitals:   10/20/17 1545  TempSrc:   PainSc: [place] C[facility]

## 2017-10-20 NOTE — Op Note (Signed)
10/20/2017  3:17 PM  PATIENT:  Daniel Chavez  41 y.o. male  PRE-OPERATIVE DIAGNOSIS:  INCARCERATED RECURRENT INCISIONAL HERNIA   POST-OPERATIVE DIAGNOSIS:  INCARCERATED RECURRENT INCISIONAL HERNIA   PROCEDURE:  Procedure(s): REPAIR OF INCARCERATED RECURRENT INCISIONAL HERNIA WITH MESH INSERTION OF MESH LYSIS OF ADHESIONS 40MIN  SURGEON: Georganna Skeans, MD  ASSISTANTS: Fanny Skates, MD  ANESTHESIA:   local and general  EBL:  Total I/O In: 1700 [I.V.:1700] Out: 225 [Urine:125; Blood:100]  BLOOD ADMINISTERED:none  DRAINS: (2) Jackson-Pratt drain(s) with closed bulb suction in the BLQ   SPECIMEN:  Excision  DISPOSITION OF SPECIMEN:  PATHOLOGY  COUNTS:  YES  DICTATION: .Dragon Dictation Findings: Incarcerated recurrent hernia containing small bowel, omentum, and colon.  Previous mesh appeared to be Gore-Tex  Procedure in detail: Daniel Chavez presents for repair of incarcerated recurrent incisional hernia with mesh.  He underwent a bowel prep preoperatively and tolerated that well.  He received intravenous antibiotics.  Informed consent was obtained.  He was brought the operating room and general endotracheal anesthesia was administered.  Foley catheter was placed by nursing.  His abdomen was prepped and draped in sterile fashion.  A timeout procedure was performed.  Midline incision was made from above the umbilicus down along his old previous scar.  Subcu tissues were dissected down and we identified the fascia cephalad to his hernia.  The fascia was divided along the midline and the peritoneal cavity was entered.  We then carefully, gradually opened the midline of his fascia.  We encountered the 2 large hernias as expected as well as a few several Swiss cheese holes in his fascia.  It looks that is old hernia was fixed with Gore-Tex.  We excised a moderate size piece of Gore-Tex and that was sent to pathology for gross identification only.  While carefully protecting the bowel, we  were able to reduce the omentum and bowel contents out of the hernia defects and with ongoing meticulous dissection opened the fascia down to the lower midline.  We lysed adhesions along the way for a total of 40 minutes.  There were no enterotomies.  We then raised subcutaneous flaps bilaterally in preparation for an inlay mesh.  Hemostasis was ensured in the subcutaneous plane bilaterally.  We then shows a 20 by 24 cm Ventrio XT mesh.  This was placed in an inlay fashion with multiple interrupted 0 Novafil sutures.  They were spaced so that there was no gaps.  The area was copiously irrigated and hemostasis was ensured.  The fascia was then closed over the top of the mesh with interrupted Novafil sutures.  Local anesthetic was injected in the fascia and subcutaneous planes.  Snyder drain was placed in each lower quadrant to drain the subcutaneous tissues.  The area was again irrigated and we checked for hemostasis.  IMA stasis was obtained and the skin was closed with staples.  All counts were correct.  He tolerated the procedure well without apparent complication and was taken recovery in stable condition. PATIENT DISPOSITION:  PACU - hemodynamically stable.   Delay start of Pharmacological VTE agent (>24hrs) due to surgical blood loss or risk of bleeding:  no  Georganna Skeans, MD, MPH, FACS Pager: [phone]  10/29/20183:17 PM

## 2017-10-20 NOTE — H&P (Signed)
  Daniel Chavez 09/24/2017 10:57 AM Location: [facility] Patient #: [id number] DOB: Feb 27, 1976 Married / Language: English / Race: White Male   History of Present Illness Daniel Neri E. Grandville Silos MD; 09/24/2017 11:25 AM) The patient is a 41 year old male who presents to discuss consultation. Daniel Chavez of his recurrent incisional hernia. His neurologist, Dr. Jannifer Chavez, has started him on a new medication for his MS. He has been off his prednisone for a few weeks already. Symptoms are the same with some soreness along his incisional hernia. No nausea or vomiting. He is moving his bowels well.   Medication History Daniel Chavez, CMA; 09/24/2017 10:59 AM) Natalizumab (300MG/15ML Concentrate, Intravenous) Active. Meclizine HCl (12.5MG Tablet, Oral) Active. ALPRAZolam & Diet Manage Prod (0.5MG Misc, Oral) Active. BuPROPion HCl ER (SR) (150MG Tablet ER 12HR, Oral daily) Active. Omeprazole (40MG Capsule DR, Oral daily) Active. PredniSONE (10MG Tablet, Oral daily) Active. ALPRAZolam (0.5MG Tablet, Oral) Active. Medications Reconciled  Vitals (Daniel Chavez CMA; 09/24/2017 10:57 AM) 09/24/2017 10:57 AM Weight: 253.38 lb Height: 75in Body Surface Area: 2.43 m Body Mass Index: 31.67 kg/m  Temp.: 98.10F  Pulse: 90 (Regular)  P.OX: 97% (Room air) BP: 126/90 (Sitting, Left Arm, Standard)       Physical Exam Daniel Neri E. Grandville Silos MD; 09/24/2017 11:28 AM) General Note: No distress   Integumentary Note: Warm and dry   Head and Neck Note: Normocephalic, neck is supple   Chest and Lung Exam Note: Clear to auscultation bilaterally   Cardiovascular Note: Regular rate and rhythm   Abdomen Note: Soft, incisional hernia is present with hernia on the left and right of his umbilical region, both are only partly reducible, no pain on examination   Neurologic Note: Awake and alert, speech fluid, gait more steady than on his previous  exam     Assessment & Plan Daniel Neri E. Grandville Silos MD; 09/24/2017 11:30 AM) INCISIONAL HERNIA, WITHOUT OBSTRUCTION OR GANGRENE (K43.2) Impression: I will schedule him for repair of recurrent incisional hernia with mesh for the end of this month. I will give him 6 weeks off his prednisone. I had a long discussion with Dr. Jannifer Chavez in regards to his immunotherapy for his multiple sclerosis. That should not increase perioperative complications for his hernia repair. I will plan to do a MiraLAX bowel prep and I advised him regarding the procedure, risks, benefits, and the expected postoperative course. I answered his questions and his wife's questions.  10/31 re-examined. No changes. I spoke with his wife. Daniel Skeans, MD, MPH, FACS Trauma: [phone] General Surgery: [phone]

## 2017-10-20 NOTE — Interval H&P Note (Signed)
History and Physical Interval Note:  10/20/2017 12:36 PM  Daniel Chavez  has presented today for surgery, with the diagnosis of RECURRENT INCISIONAL HERNIA   The various methods of treatment have been discussed with the patient and family. After consideration of risks, benefits and other options for treatment, the patient has consented to  Procedure(s): REPAIR OF RECURRENT INCISIONAL HERNIA WITH MESH (N/A) INSERTION OF MESH (N/A) as a surgical intervention .  The patient's history has been reviewed, patient examined, no change in status, stable for surgery.  I have reviewed the patient's chart and labs.  Questions were answered to the patient's satisfaction.     [name]

## 2017-10-20 NOTE — Transfer of Care (Signed)
Immediate Anesthesia Transfer of Care Note  Patient: Daniel Chavez  Procedure(s) Performed: REPAIR OF RECURRENT INCISIONAL HERNIA WITH MESH (N/A Abdomen) INSERTION OF MESH (N/A Abdomen)  Patient Location: PACU  Anesthesia Type:General  Level of Consciousness: awake  Airway & Oxygen Therapy: Patient Spontanous Breathing and Patient connected to face mask oxygen  Post-op Assessment: Report given to RN and Post -op Vital signs reviewed and stable  Post vital signs: Reviewed and stable  Last Vitals:  Vitals:   10/20/17 1108  BP: (!) 151/88  Pulse: 82  Resp: 20  Temp: 37.2 C  SpO2: 95%    Last Pain:  Vitals:   10/20/17 1108  TempSrc: Oral      Patients Stated Pain Goal: 6 (24/09/[birth year] 5329)  Complications: No apparent anesthesia complications

## 2017-10-20 NOTE — Plan of Care (Signed)
Problem: Pain Managment: Goal: General experience of comfort will improve Outcome: Progressing Patient's pain controlled with current pain regimen.

## 2017-10-20 NOTE — Anesthesia Procedure Notes (Signed)
Procedure Name: Intubation Date/Time: 10/20/2017 12:57 PM Performed by: Lieutenant Diego Pre-anesthesia Checklist: Patient identified, Emergency Drugs available, Suction available and Patient being monitored Patient Re-evaluated:Patient Re-evaluated prior to induction Oxygen Delivery Method: Circle system utilized Preoxygenation: Pre-oxygenation with 100% oxygen Induction Type: IV induction Ventilation: Mask ventilation without difficulty Laryngoscope Size: Miller and 2 Grade View: Grade I Tube type: Oral Tube size: 8.0 mm Number of attempts: 1 Airway Equipment and Method: Stylet and Oral airway Placement Confirmation: ETT inserted through vocal cords under direct vision,  positive ETCO2 and breath sounds checked- equal and bilateral Secured at: 21 cm Tube secured with: Tape Dental Injury: Teeth and Oropharynx as per pre-operative assessment

## 2017-10-21 ENCOUNTER — Encounter (HOSPITAL_COMMUNITY): Payer: Self-pay | Admitting: General Surgery

## 2017-10-21 LAB — BASIC METABOLIC PANEL
Anion gap: 7 (ref 5–15)
BUN: 15 mg/dL (ref 6–20)
CO2: 28 mmol/L (ref 22–32)
Calcium: 9.1 mg/dL (ref 8.9–10.3)
Chloride: 104 mmol/L (ref 101–111)
Creatinine, Ser: 1.03 mg/dL (ref 0.61–1.24)
GFR calc Af Amer: >60 mL/min (ref >60)
GFR calc non Af Amer: >60 mL/min (ref >60)
Glucose, Bld: 115 mg/dL — ABNORMAL HIGH (ref 65–99)
Potassium: 4.6 mmol/L (ref 3.5–5.1)
Sodium: 139 mmol/L (ref 135–145)

## 2017-10-21 LAB — CBC
HCT: 39.4 % (ref 39.0–52.0)
Hemoglobin: 13 g/dL (ref 13.0–17.0)
MCH: 30.2 pg (ref 26.0–34.0)
MCHC: 33 g/dL (ref 30.0–36.0)
MCV: 91.6 fL (ref 78.0–100.0)
Platelets: 224 10*3/uL (ref 150–400)
RBC: 4.3 MIL/uL (ref 4.22–5.81)
RDW: 13.4 % (ref 11.5–15.5)
WBC: 13 10*3/uL — ABNORMAL HIGH (ref 4.0–10.5)

## 2017-10-21 MED ORDER — FENTANYL CITRATE (PF) 100 MCG/2ML IJ SOLN
25.0000 ug | INTRAMUSCULAR | Status: DC | PRN
  Administered 2017-10-21 – 2017-10-24 (×15): 50 ug via INTRAVENOUS
  Filled 2017-10-21 (×16): qty 2

## 2017-10-21 MED ORDER — MENTHOL 3 MG MT LOZG
1.0000 | LOZENGE | OROMUCOSAL | Status: DC | PRN
  Filled 2017-10-21: qty 9

## 2017-10-21 NOTE — Progress Notes (Signed)
1 Day Post-Op   Subjective/Chief Complaint: No flatus, Dilaudid caused itching   Objective: Vital signs in last 24 hours: Temp:  [97.8 F (36.6 C)-99 F (37.2 C)] 97.8 F (36.6 C) (10/30 0627) Pulse Rate:  [71-97] 71 (10/30 0627) Resp:  [12-21] 18 (10/30 0627) BP: (109-151)/(75-93) 126/75 (10/30 0627) SpO2:  [95 %-99 %] 99 % (10/30 0627) Weight:  [116.8 kg (257 lb 6.4 oz)-117.9 kg (260 lb)] 117.9 kg (260 lb) (10/29 1900) Last BM Date: 10/20/17  Intake/Output from previous day: 10/29 0701 - 10/30 0700 In: 1700 [I.V.:1700] Out: 1553 [Urine:1075; Drains:378; Blood:100] Intake/Output this shift: No intake/output data recorded.  General appearance: alert and cooperative Neck: supple, symmetrical, trachea midline Resp: clear to auscultation bilaterally Cardio: regular rate and rhythm GI: soft, quiet, incision with dressing, drains SS Extremities: no edema Neurologic: Mental status: Alert, oriented, thought content appropriate  Lab Results:   Recent Labs  10/20/17 1831 10/21/17 0539  WBC 17.9* 13.0*  HGB 14.1 13.0  HCT 42.3 39.4  PLT 225 224   BMET  Recent Labs  10/20/17 1831 10/21/17 0539  NA  --  139  K  --  4.6  CL  --  104  CO2  --  28  GLUCOSE  --  115*  BUN  --  15  CREATININE 0.96 1.03  CALCIUM  --  9.1   PT/INR No results for input(s): LABPROT, INR in the last 72 hours. ABG No results for input(s): PHART, HCO3 in the last 72 hours.  Invalid input(s): PCO2, PO2  Studies/Results: No results found.  Anti-infectives: Anti-infectives    Start     Dose/Rate Route Frequency Ordered Stop   10/20/17 1054  ceFAZolin (ANCEF) 2-4 GM/100ML-% IVPB    Comments:  Daniel Chavez   : cabinet override      10/20/17 1054 10/20/17 1245   10/20/17 1049  ceFAZolin (ANCEF) IVPB 2g/100 mL premix     2 g 200 mL/hr over 30 Minutes Intravenous On call to O.R. 10/20/17 1049 10/20/17 1315      Assessment/Plan: S/P repair incarcerated recurrent incisional hernia  with mesh 10/29 - clears until bowel function improves, binder MS - home neurologic protocol Deconditioning - post-op as well as MS related. PT/OT evals FEN - clears as above, change dilaudid to fentanyl VTE - Lovenox Dispo - hope to progress to home over next day or two. Wife very supportive.  LOS: 0 days    Daniel Chavez E 10/21/2017

## 2017-10-21 NOTE — Progress Notes (Addendum)
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 Pt currently requires min A with mobility with HHA and mod A with LB ADL. Pt complained of feeling "lightheaded". Pt diaphoretic after mobility; BP 137/84. Pt complaining of R upper trap pain - most likely muscle strain - applied heat pack and notified nsg of pt request for medication for muscle spasms.  Will follow acutely to maximize independence with ADL and mobility to facilitate safe DC home with intermittent S. Wife pans to go back to work next week but can take some days off if needed. 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     Follow Up Recommendations  No OT follow up;Supervision - Intermittent    Equipment Recommendations  3 in 1 bedside commode    Recommendations for Other Services       Precautions / Restrictions Precautions Precautions: Fall Precaution Comments: hx of vertigo related to MS (per Pt) takes Meclazine PRN; 2 jp drains Required Braces or Orthoses:  (Abdominal binder) Restrictions Weight Bearing Restrictions: No      Mobility Bed Mobility Overal bed mobility: Needs Assistance Bed Mobility: Sidelying to Sit   Sidelying to sit: Min assist       General bed mobility comments: Educated on rolling to side to get in/outof bed; pt fearful of moving  Transfers Overall transfer level: Needs assistance   Transfers: Sit to/from Stand Sit to Stand: Min assist;+2 safety/equipment              Balance Overall balance assessment: Needs assistance   Sitting balance-Leahy Scale: Good       Standing balance-Leahy  Scale: Fair Standing balance comment: hx of vertigo                           ADL either performed or assessed with clinical judgement   ADL Overall ADL's : Needs assistance/impaired     Grooming: Standing;Supervision/safety   Upper Body Bathing: Supervision/ safety;Standing   Lower Body Bathing: Minimal assistance;Sit to/from stand   Upper Body Dressing : Supervision/safety;Set up;Sitting   Lower Body Dressing: Moderate assistance;Sit to/from stand   Toilet Transfer: Minimal assistance;Comfort height toilet;Ambulation     Toileting - Clothing Manipulation Details (indicate cue type and reason): foley     Functional mobility during ADLs: Minimal assistance;+2 for safety/equipment General ADL Comments: Pt has difficulty crossing feet over knees; may benefit from AE     Vision Baseline Vision/History: No visual deficits       Perception     Praxis      Pertinent Vitals/Pain Pain Assessment: 0-10 Pain Score: 8  Pain Location: abdomen; R upper traps Pain Descriptors / Indicators: Discomfort;Moaning;Sore Pain Intervention(s): Premedicated before session;Patient requesting pain meds-RN notified;Heat applied (heat to R shoulder)     Hand Dominance Right   Extremity/Trunk Assessment Upper Extremity Assessment Upper Extremity Assessment: Overall WFL for tasks assessed (complaining of R upper trap pain)   Lower Extremity Assessment Lower Extremity Assessment: Defer to PT evaluation   Cervical / Trunk Assessment Cervical / Trunk Assessment: Other exceptions (guarding abdomen)   Communication Communication  Communication: No difficulties   Cognition Arousal/Alertness: Awake/alert Behavior During Therapy: WFL for tasks assessed/performed Overall Cognitive Status: Within Functional Limits for tasks assessed                                     General Comments       Exercises     Shoulder Instructions      Home Living Family/patient  expects to be discharged to:: Private residence Living Arrangements: Spouse/significant other Available Help at Discharge: Family;Available PRN/intermittently Type of Home: House Home Access: Stairs to enter [employer] of Steps: 1-2 from garage Entrance Stairs-Rails: None Home Layout: Two level;Bed/bath upstairs Alternate Level Stairs-Number of Steps: flight   Bathroom Shower/Tub: Walk-in shower;Tub/shower unit   Bathroom Toilet: Handicapped height Bathroom Accessibility: Yes How Accessible: Accessible via walker Home Equipment: None          Prior Functioning/Environment Level of Independence: Independent        Comments: drives; works Film/video editor Problem List: Decreased strength;Decreased activity tolerance;Impaired balance (sitting and/or standing);Decreased knowledge of use of DME or AE;Decreased knowledge of precautions;Obesity;Pain      OT Treatment/Interventions: Self-care/ADL training;DME and/or AE instruction;Therapeutic activities;Patient/family education    OT Goals(Current goals can be found in the care plan section) Acute Rehab OT Goals Patient Stated Goal: to not have pain OT Goal Formulation: With patient Time For Goal Achievement: 11/04/17 Potential to Achieve Goals: Good ADL Goals Pt Will Perform Lower Body Bathing: with adaptive equipment;sit to/from stand;with modified independence;with caregiver independent in assisting Pt Will Perform Lower Body Dressing: with modified independence;with adaptive equipment;sit to/from stand;with caregiver independent in assisting Pt Will Transfer to Toilet: with modified independence;ambulating;bedside commode Pt Will Perform Toileting - Clothing Manipulation and hygiene: with modified independence;sit to/from stand Pt Will Perform Tub/Shower Transfer: with modified independence;3 in 1;ambulating;Shower transfer  OT Frequency: Min 3X/week   Barriers to D/C:            Co-evaluation               AM-PAC PT "6 Clicks" Daily Activity     Outcome Measure Help from another person eating meals?: None Help from another person taking care of personal grooming?: None Help from another person toileting, which includes using toliet, bedpan, or urinal?: A Little Help from another person bathing (including washing, rinsing, drying)?: A Little Help from another person to put on and taking off regular upper body clothing?: A Little Help from another person to put on and taking off regular lower body clothing?: A Lot 6 Click Score: 19   End of Session Equipment Utilized During Treatment: Gait belt Nurse Communication: Mobility status;Precautions  Activity Tolerance: Patient tolerated treatment well Patient left: in chair;with call bell/phone within reach;with family/visitor present  OT Visit Diagnosis: Unsteadiness on feet (R26.81);Muscle weakness (generalized) (M62.81);Pain Pain - Right/Left: Right Pain - part of body: Shoulder (abdomen)                Time: 8466-5993 OT Time Calculation (min): 32 min Charges:  OT General Charges $OT Visit: 1 Visit OT Evaluation $OT Eval Low Complexity: 1 Low OT Treatments $Self Care/Home Management : 8-22 mins G-Codes: OT G-codes **NOT FOR INPATIENT CLASS** Functional Assessment Tool Used: Clinical judgement Functional Limitation: Self care Self Care Current Status (T7017): At least 20 percent but less than 40 percent impaired, limited or restricted Self Care Goal Status (  G8988): At least 1 percent but less than 20 percent impaired, limited or restricted   Tristar [facility], OT/L  [phone] 10/21/2017  Chrishana Spargur,HILLARY 10/21/2017, 9:56 AM

## 2017-10-21 NOTE — Evaluation (Signed)
Physical Therapy Evaluation Patient Details Name: Daniel Chavez MRN: [id number] DOB: June 22, 1976 Today's Date: 10/21/2017   History of Present Illness  41 yo male with MS who underwent [place] WITH MESH  Clinical Impression  Pt admitted with above. Pt indep PTA now mobility limited mostly by pain but anticipate with progress well. Acute PT to con't to follow.    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 (per Pt) takes Meclazine PRN; 2 jp drains Required Braces or Orthoses:  (Abdominal binder) Restrictions Weight Bearing Restrictions: No      Mobility  Bed Mobility Overal bed mobility: Needs Assistance Bed Mobility: Sit to Sidelying   Sidelying to sit: Min assist       General bed mobility comments: minA for LE management back into bed  Transfers Overall transfer level: Needs assistance Equipment used: Rolling walker (2 wheeled) Transfers: Sit to/from Stand Sit to Stand: Min guard         General transfer comment: v/c's to push up from arm rests and not pull up on RW  Ambulation/Gait Ambulation/Gait assistance: Min assist Ambulation Distance (Feet): 120 Feet Assistive device: Rolling walker (2 wheeled) Gait Pattern/deviations: Step-through pattern;Decreased stride length;Trunk flexed Gait velocity: slow Gait velocity interpretation: Below normal speed for age/gender General Gait Details: pt with increased trunk flexion due to abdominal pain with back extension. v/c's to decrease bilat UE WBing on RW, 1 standing rest break, increased fluidity with progression of ambulation  Stairs            Wheelchair Mobility    Modified Rankin  (Stroke Patients Only)       Balance Overall balance assessment: Needs assistance   Sitting balance-Leahy Scale: Good     Standing balance support: Bilateral upper extremity supported Standing balance-Leahy Scale: Fair Standing balance comment: hx of vertigo                             Pertinent Vitals/Pain Pain Assessment: 0-10 Pain Score: 8  Pain Location: abdomen; R upper traps Pain Descriptors / Indicators: Discomfort;Moaning;Sore Pain Intervention(s): Patient requesting pain meds-RN notified (RN gave IV dilaudid at end of session)    Home Living Family/patient expects to be discharged to:: Private residence Living Arrangements: Spouse/significant other Available Help at Discharge: Family;Available PRN/intermittently Type of Home: House Home Access: Stairs to enter Entrance Stairs-Rails: None Entrance Stairs-Number of Steps: 1-2 from garage Home Layout: Two level;Bed/bath upstairs Home Equipment: None      Prior Function Level of Independence: Independent         Comments: drives; works Electronics engineer   Dominant Hand: Right    Extremity/Trunk Assessment   Upper Extremity Assessment Upper Extremity Assessment: Overall WFL for tasks assessed    Lower Extremity Assessment Lower Extremity Assessment: Overall WFL for tasks assessed    Cervical / Trunk Assessment Cervical / Trunk Assessment: Other exceptions (guarding abdomen)  Communication   Communication: No difficulties  Cognition Arousal/Alertness: Awake/alert Behavior During Therapy: Anxious Overall Cognitive Status: Within Functional Limits for tasks assessed  General Comments: anxious re: mobility due to pain      General Comments General comments (skin integrity, edema, etc.): NT empitied bilat jp drains    Exercises     Assessment/Plan    PT Assessment Patient needs continued PT services  PT Problem List  Decreased strength;Decreased activity tolerance;Decreased balance;Decreased mobility;Pain       PT Treatment Interventions DME instruction;Gait training;Stair training;Functional mobility training;Therapeutic activities;Therapeutic exercise;Balance training    PT Goals (Current goals can be found in the Care Plan section)  Acute Rehab PT Goals Patient Stated Goal: to not have pain PT Goal Formulation: With patient Time For Goal Achievement: 10/28/17 Potential to Achieve Goals: Good    Frequency Min 3X/week   Barriers to discharge        Co-evaluation               AM-PAC PT "6 Clicks" Daily Activity  Outcome Measure Difficulty turning over in bed (including adjusting bedclothes, sheets and blankets)?: A Little Difficulty moving from lying on back to sitting on the side of the bed? : A Little Difficulty sitting down on and standing up from a chair with arms (e.g., wheelchair, bedside commode, etc,.)?: A Little Help needed moving to and from a bed to chair (including a wheelchair)?: A Little Help needed walking in hospital room?: A Little Help needed climbing 3-5 steps with a railing? : A Little 6 Click Score: 18    End of Session Equipment Utilized During Treatment: Gait belt (abdominal binder) Activity Tolerance: Patient tolerated treatment well Patient left: in bed;with call bell/phone within reach;with family/visitor present Nurse Communication: Mobility status;Patient requests pain meds PT Visit Diagnosis: Unsteadiness on feet (R26.81);Pain Pain - part of body:  (abdomen)    Time: 4695-0722 PT Time Calculation (min) (ACUTE ONLY): 24 min   Charges:   PT Evaluation $PT Eval Moderate Complexity: 1 Mod PT Treatments $Gait Training: 8-22 mins   PT G Codes:   PT G-Codes **NOT FOR INPATIENT CLASS** Functional Assessment Tool Used: Clinical judgement Functional Limitation: Mobility: Walking and moving around Mobility: Walking and Moving Around Current Status  (V7505): At least 40 percent but less than 60 percent impaired, limited or restricted Mobility: Walking and Moving Around Goal Status [phone]): At least 1 percent but less than 20 percent impaired, limited or restricted    Kittie Plater, PT, DPT Pager #: [phone] Office #: [phone]   [place] 10/21/2017, 11:08 AM

## 2017-10-22 DIAGNOSIS — K660 Peritoneal adhesions (postprocedural) (postinfection): Secondary | ICD-10-CM | POA: Diagnosis present

## 2017-10-22 DIAGNOSIS — G35 Multiple sclerosis: Secondary | ICD-10-CM | POA: Diagnosis present

## 2017-10-22 DIAGNOSIS — K430 Incisional hernia with obstruction, without gangrene: Secondary | ICD-10-CM | POA: Diagnosis present

## 2017-10-22 DIAGNOSIS — Z23 Encounter for immunization: Secondary | ICD-10-CM | POA: Diagnosis not present

## 2017-10-22 DIAGNOSIS — Z6831 Body mass index (BMI) 31.0-31.9, adult: Secondary | ICD-10-CM | POA: Diagnosis not present

## 2017-10-22 DIAGNOSIS — E669 Obesity, unspecified: Secondary | ICD-10-CM | POA: Diagnosis present

## 2017-10-22 DIAGNOSIS — Z87891 Personal history of nicotine dependence: Secondary | ICD-10-CM | POA: Diagnosis not present

## 2017-10-22 DIAGNOSIS — K567 Ileus, unspecified: Secondary | ICD-10-CM | POA: Diagnosis not present

## 2017-10-22 DIAGNOSIS — F419 Anxiety disorder, unspecified: Secondary | ICD-10-CM | POA: Diagnosis present

## 2017-10-22 LAB — BASIC METABOLIC PANEL
Anion gap: 8 (ref 5–15)
BUN: 10 mg/dL (ref 6–20)
CO2: 27 mmol/L (ref 22–32)
Calcium: 9 mg/dL (ref 8.9–10.3)
Chloride: 103 mmol/L (ref 101–111)
Creatinine, Ser: 1.12 mg/dL (ref 0.61–1.24)
GFR calc Af Amer: >60 mL/min (ref >60)
GFR calc non Af Amer: >60 mL/min (ref >60)
Glucose, Bld: 112 mg/dL — ABNORMAL HIGH (ref 65–99)
Potassium: 3.9 mmol/L (ref 3.5–5.1)
Sodium: 138 mmol/L (ref 135–145)

## 2017-10-22 LAB — CBC
HCT: 37.8 % — ABNORMAL LOW (ref 39.0–52.0)
Hemoglobin: 12.3 g/dL — ABNORMAL LOW (ref 13.0–17.0)
MCH: 29.8 pg (ref 26.0–34.0)
MCHC: 32.5 g/dL (ref 30.0–36.0)
MCV: 91.5 fL (ref 78.0–100.0)
Platelets: 222 10*3/uL (ref 150–400)
RBC: 4.13 MIL/uL — ABNORMAL LOW (ref 4.22–5.81)
RDW: 13.6 % (ref 11.5–15.5)
WBC: 9.7 10*3/uL (ref 4.0–10.5)

## 2017-10-22 MED ORDER — TRAMADOL HCL 50 MG PO TABS
100.0000 mg | ORAL_TABLET | Freq: Four times a day (QID) | ORAL | Status: DC | PRN
  Administered 2017-10-23 – 2017-10-25 (×3): 100 mg via ORAL
  Filled 2017-10-22 (×3): qty 2

## 2017-10-22 MED ORDER — OXYCODONE HCL 5 MG PO TABS
5.0000 mg | ORAL_TABLET | ORAL | Status: DC | PRN
  Administered 2017-10-22 – 2017-10-26 (×10): 15 mg via ORAL
  Filled 2017-10-22 (×10): qty 3

## 2017-10-22 NOTE — Progress Notes (Signed)
2 Days Post-Op   Subjective/Chief Complaint: Feels rumbling but no flatus or BM. Pain control not adequate.   Objective: Vital signs in last 24 hours: Temp:  [98.5 F (36.9 C)-99.2 F (37.3 C)] 98.7 F (37.1 C) (10/30 2012) Pulse Rate:  [82-93] 89 (10/30 2012) Resp:  [18-20] 20 (10/30 2012) BP: (109-136)/(72-86) 136/86 (10/30 2012) SpO2:  [93 %-96 %] 96 % (10/30 2012) Last BM Date: 10/20/17  Intake/Output from previous day: 10/30 0701 - 10/31 0700 In: 2420.8 [P.O.:480; I.V.:1880.8; IV Piggyback:60] Out: 2735 [Urine:2525; Drains:210] Intake/Output this shift: No intake/output data recorded.  General appearance: alert and cooperative Resp: clear to auscultation bilaterally Cardio: regular rate and rhythm GI: soft, active BS, JP sang  Lab Results:   Recent Labs  10/21/17 0539 10/22/17 0618  WBC 13.0* 9.7  HGB 13.0 12.3*  HCT 39.4 37.8*  PLT 224 222   BMET  Recent Labs  10/21/17 0539 10/22/17 0618  NA 139 138  K 4.6 3.9  CL 104 103  CO2 28 27  GLUCOSE 115* 112*  BUN 15 10  CREATININE 1.03 1.12  CALCIUM 9.1 9.0   PT/INR No results for input(s): LABPROT, INR in the last 72 hours. ABG No results for input(s): PHART, HCO3 in the last 72 hours.  Invalid input(s): PCO2, PO2  Studies/Results: No results found.  Anti-infectives: Anti-infectives    Start     Dose/Rate Route Frequency Ordered Stop   10/20/17 1054  ceFAZolin (ANCEF) 2-4 GM/100ML-% IVPB    Comments:  [name]   : cabinet override      10/20/17 1054 10/20/17 1245   10/20/17 1049  ceFAZolin (ANCEF) IVPB 2g/100 mL premix     2 g 200 mL/hr over 30 Minutes Intravenous On call to O.R. 10/20/17 1049 10/20/17 1315      Assessment/Plan: S/P repair incarcerated recurrent incisional hernia with mesh 10/29 - fulls, get a larger binder MS - home neurologic protocol Deconditioning - post-op as well as MS related. PT/OT evals appreciated FEN - fulls, increase scheduled Ultram, increase Oxy  scale VTE - Lovenox Dispo - hope to progress to home over next day or two. Wife very supportive.  LOS: 0 days    Carri Spillers E 10/22/2017

## 2017-10-22 NOTE — Care Management Note (Signed)
Case Management Note  Patient Details  Name: Daniel Chavez MRN: [id number] Date of Birth: [birth year]  Subjective/Objective:                    Action/Plan:   Expected Discharge Date:                  Expected Discharge Plan:  Home/Self Care  In-House Referral:     Discharge planning Services     Post Acute Care Choice:    Choice offered to:  Patient  DME Arranged:  Gilford Rile rolling DME Agency:  [place]:    [place]:     Status of Service:  Completed, signed off  If discussed at [place] of Stay Meetings, dates discussed:    Additional Comments:  Marilu Favre, RN 10/22/2017, 11:28 AM

## 2017-10-22 NOTE — Progress Notes (Signed)
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 Acute PT to follow to progress ambulation without AD and stair negotiation.   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 (per Pt) takes Meclazine PRN; 2 jp drains Restrictions Weight Bearing Restrictions: No    Mobility  Bed Mobility Overal bed mobility: Modified Independent Bed Mobility: Rolling;Sidelying to Sit Rolling: Modified independent (Device/Increase time) Sidelying to sit: Modified independent (Device/Increase time)       General bed mobility comments: hob elevated to 30 deg, used bed rail  Transfers Overall transfer level: Needs assistance Equipment used: Rolling walker (2 wheeled) Transfers: Sit to/from Stand Sit to Stand: Min guard         General transfer comment: v/c's to push up from bed not pull up on walker  Ambulation/Gait Ambulation/Gait assistance: Supervision Ambulation Distance (Feet): 500 Feet Assistive device: Rolling walker (2 wheeled) Gait Pattern/deviations: Step-through pattern;Decreased stride length;Trunk flexed Gait velocity: ecr Gait velocity interpretation: Below normal speed for age/gender General Gait Details: pt with increased fluidity and more upright posture today compared to yesterday. pt with increased trunk flexion with onset of fatigue with progressive ambulation distance   Stairs            Wheelchair Mobility    Modified Rankin (Stroke Patients  Only)       Balance Overall balance assessment: Needs assistance   Sitting balance-Leahy Scale: Good     Standing balance support: No upper extremity supported Standing balance-Leahy Scale: Fair Standing balance comment: pt able to amb in room without AD                            Cognition Arousal/Alertness: Awake/alert Behavior During Therapy: WFL for tasks assessed/performed Overall Cognitive Status: Within Functional Limits for tasks assessed                                        Exercises      General Comments        Pertinent Vitals/Pain Pain Assessment: 0-10 Pain Score: 6  Pain Location: abdomen; R upper traps Pain Descriptors / Indicators: Discomfort;Moaning;Sore Pain Intervention(s): Monitored during session    Home Living                      Prior Function            PT Goals (current goals can now be found in the care plan section) Acute Rehab PT Goals Patient Stated Goal: to not have pain Progress towards PT goals: Progressing toward goals    Frequency    Min 3X/week      PT Plan Current plan remains appropriate    Co-evaluation              AM-PAC PT "6 Clicks" Daily Activity  Outcome Measure  Difficulty turning over in bed (  including adjusting bedclothes, sheets and blankets)?: A Little Difficulty moving from lying on back to sitting on the side of the bed? : A Little Difficulty sitting down on and standing up from a chair with arms (e.g., wheelchair, bedside commode, etc,.)?: A Little Help needed moving to and from a bed to chair (including a wheelchair)?: A Little Help needed walking in hospital room?: A Little Help needed climbing 3-5 steps with a railing? : A Little 6 Click Score: 18    End of Session   Activity Tolerance: Patient tolerated treatment well Patient left: in chair;with call bell/phone within reach Nurse Communication: Mobility status PT Visit Diagnosis: Unsteadiness  on feet (R26.81);Pain Pain - part of body:  (abdomen)     Time: 4356-8616 PT Time Calculation (min) (ACUTE ONLY): 18 min  Charges:  $Gait Training: 8-22 mins                    G Codes:       Kittie Plater, PT, DPT Pager #: [phone] Office #: [phone]    [place] 10/22/2017, 11:08 AM

## 2017-10-23 ENCOUNTER — Ambulatory Visit: Payer: Managed Care, Other (non HMO) | Admitting: Adult Health

## 2017-10-23 MED ORDER — PROMETHAZINE HCL 25 MG/ML IJ SOLN
25.0000 mg | Freq: Four times a day (QID) | INTRAMUSCULAR | Status: DC | PRN
  Administered 2017-10-23 (×2): 25 mg via INTRAVENOUS
  Filled 2017-10-23 (×2): qty 1

## 2017-10-23 MED ORDER — LORAZEPAM 2 MG/ML IJ SOLN
1.0000 mg | Freq: Four times a day (QID) | INTRAMUSCULAR | Status: DC | PRN
Start: 2017-10-23 — End: 2017-10-26

## 2017-10-23 NOTE — Progress Notes (Signed)
Patient ID: Daniel Chavez, male   DOB: 1976-03-28, 41 y.o.   MRN: [id number] Vomited again. Increase IVF. Otherwise doing OK. Georganna Skeans, MD, MPH, FACS Trauma: [phone] General Surgery: [phone]

## 2017-10-23 NOTE — Progress Notes (Signed)
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 Educated on compensatory strategies for LB ADL for pain control and frequent mobility during the day with RN staff. D/c plan remains appropriate. Will continue to follow acutely.   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 Increased time and effort  Transfers Overall transfer level: Needs assistance Equipment used: None Transfers: Sit to/from Stand Sit to Stand: Supervision         General transfer comment: for safety, no physical assist. Increased time and effort    Balance Overall balance assessment: Needs assistance Sitting-balance support: Feet supported;No upper extremity supported Sitting balance-Leahy Scale: Good     Standing balance support: No upper extremity supported;During functional activity Standing balance-Leahy Scale: Fair                             ADL either performed or assessed with clinical  judgement   ADL Overall ADL's : Needs assistance/impaired     Grooming: Supervision/safety;Standing;Wash/dry hands               Lower Body Dressing: Supervision/safety;Sit to/from stand Lower Body Dressing Details (indicate cue type and reason): Pt able to cross foot over opposite knee to adjust socks. Discussed compensatory strategies for LB ADL due to abdominal incisions/pain Toilet Transfer: Supervision/safety;Ambulation;Regular Glass blower/designer Details (indicate cue type and reason): Simulated by sit to stand from EOB with functional mobility to bathroom; able to stand at toilet to void     Tub/ Shower Transfer: Supervision/safety;Walk-in shower;Ambulation Tub/Shower Transfer Details (indicate cue type and reason): Discussed walk in shower transfer technique; pt able to return demo simulation in room Functional mobility during ADLs: Supervision/safety (holding IV pole)       Vision       Perception     Praxis      Cognition Arousal/Alertness: Awake/alert Behavior During Therapy: WFL for tasks assessed/performed Overall Cognitive Status: Within Functional Limits for tasks assessed                                          Exercises     Shoulder Instructions       General Comments pt and wife reported ambulating unit prior to PT arrival    Pertinent Vitals/ Pain       Pain Assessment: 0-10 Pain Score: 8  Pain Location: abdomen Pain Descriptors / Indicators: Discomfort;Guarding;Grimacing;Sore Pain Intervention(s): Monitored during session;Patient requesting pain meds-RN notified  Home Living                                          Prior Functioning/Environment              Frequency  Min 3X/week        Progress Toward Goals  OT Goals(current goals can now be found in the care plan section)  Progress towards OT goals: Progressing toward goals  Acute Rehab OT Goals Patient Stated Goal: to not have  pain OT Goal Formulation: With patient  Plan Discharge plan remains appropriate    Co-evaluation                 AM-PAC PT "6 Clicks" Daily Activity     Outcome Measure   Help from another person eating meals?: None Help from another person taking care of personal grooming?: None Help from another person toileting, which includes using toliet, bedpan, or urinal?: A Little Help from another person bathing (including washing, rinsing, drying)?: A Little Help from another person to put on and taking off regular upper body clothing?: None Help from another person to put on and taking off regular lower body clothing?: A Little 6 Click Score: 21    End of Session    OT Visit Diagnosis: Unsteadiness on feet (R26.81);Muscle weakness (generalized) (M62.81);Pain Pain - part of body:  (abdomen)   Activity Tolerance Patient tolerated treatment well   Patient Left in bed;with call bell/phone within reach   Nurse Communication Mobility status;Patient requests pain meds;Other (comment) (IV beeping)        Time: 9480-1655 OT Time Calculation (min): 18 min  Charges: OT General Charges $OT Visit: 1 Visit OT Treatments $Self Care/Home Management : 8-22 mins  Kariya Lavergne A. Ulice Brilliant, M.S., OTR/L Pager: [employer] 10/23/2017, 3:25 PM

## 2017-10-23 NOTE — Progress Notes (Signed)
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 Pt tolerated ambulation of 251f working on gait without UE support as well as stair training. Pt overall required supervision/min guard for safety. Pt continues to be guarded with gait. Continue to progress as tolerated.     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 (per Pt) takes Meclazine PRN; 2 jp drains    Mobility  Bed Mobility               General bed mobility comments: pt ambulating with wife; verbally reviewed log roll technique   Transfers Overall transfer level: Needs assistance Equipment used: None Transfers: Sit to/from Stand Sit to Stand: Supervision         General transfer comment: supervision for safety  Ambulation/Gait Ambulation/Gait assistance: Supervision;Min guard Ambulation Distance (Feet):  (1028fpushing IV pole and 10061fithout UE support)   Gait Pattern/deviations: Step-through pattern;Decreased stride length;Decreased step length - left;Decreased step length - right     General Gait Details: cues for posture and increased bilat step length; pt slow, guarded movements and slightly unsteady without UE support but no LOB   Stairs Stairs: Yes   Stair Management: One rail Left;Step to pattern;Forwards Number of Stairs: 10 General stair comments: cues for step to pattern; min guard for safety  Wheelchair Mobility     Modified Rankin (Stroke Patients Only)       Balance Overall balance assessment: Needs assistance   Sitting balance-Leahy Scale: Good     Standing balance support: No upper extremity supported Standing balance-Leahy Scale: Fair                              Cognition Arousal/Alertness: Awake/alert Behavior During Therapy: WFL for tasks assessed/performed Overall Cognitive Status: Within Functional Limits for tasks assessed                                        Exercises      General Comments General comments (skin integrity, edema, etc.): pt and wife reported ambulating unit prior to PT arrival      Pertinent Vitals/Pain Pain Assessment: 0-10 Pain Score: 8  Pain Location: abdomen Pain Descriptors / Indicators: Discomfort;Sore;Guarding Pain Intervention(s): Monitored during session;Limited activity within patient's tolerance;Repositioned;Patient requesting pain meds-RN notified    Home Living                      Prior Function            PT Goals (current goals can now be found in the care plan section) Progress towards PT goals: Progressing toward goals    Frequency    Min 3X/week      PT Plan Current plan remains appropriate    Co-evaluation  AM-PAC PT "6 Clicks" Daily Activity  Outcome Measure  Difficulty turning over in bed (including adjusting bedclothes, sheets and blankets)?: A Little Difficulty moving from lying on back to sitting on the side of the bed? : A Little Difficulty sitting down on and standing up from a chair with arms (e.g., wheelchair, bedside commode, etc,.)?: A Little Help needed moving to and from a bed to chair (including a wheelchair)?: A Little Help needed walking in hospital room?: A Little Help needed climbing 3-5 steps with a railing? : A Little 6 Click Score: 18    End of Session Equipment Utilized During Treatment: Gait belt Activity Tolerance: Patient  tolerated treatment well Patient left: in chair;with call bell/phone within reach;with family/visitor present Nurse Communication: Mobility status PT Visit Diagnosis: Unsteadiness on feet (R26.81);Pain Pain - part of body:  (abdomen)     Time: 8185-9093 PT Time Calculation (min) (ACUTE ONLY): 11 min  Charges:  $Gait Training: 8-22 mins                    G Codes:       Earney Navy, PTA Pager: [phone]     Darliss Cheney 10/23/2017, 11:45 AM

## 2017-10-23 NOTE — Progress Notes (Signed)
3 Days Post-Op   Subjective/Chief Complaint: N/V overnight, flatus X 2 this AM   Objective: Vital signs in last 24 hours: Temp:  [97.8 F (36.6 C)-99.2 F (37.3 C)] 97.8 F (36.6 C) (11/01 0458) Pulse Rate:  [87-105] 105 (11/01 0458) Resp:  [15] 15 (11/01 0200) BP: (139-142)/(91-97) 142/97 (11/01 0458) SpO2:  [92 %-98 %] 92 % (11/01 0458) Last BM Date: 10/20/17  Intake/Output from previous day: 10/31 0701 - 11/01 0700 In: 120 [P.O.:120] Out: 400 [Urine:300; Drains:100] Intake/Output this shift: No intake/output data recorded.  General appearance: cooperative Resp: clear to auscultation bilaterally Cardio: regular rate and rhythm GI: soft, small dry stain on dressing, JPs sang, +BS, moderate distention  Lab Results:   Recent Labs  10/21/17 0539 10/22/17 0618  WBC 13.0* 9.7  HGB 13.0 12.3*  HCT 39.4 37.8*  PLT 224 222   BMET  Recent Labs  10/21/17 0539 10/22/17 0618  NA 139 138  K 4.6 3.9  CL 104 103  CO2 28 27  GLUCOSE 115* 112*  BUN 15 10  CREATININE 1.03 1.12  CALCIUM 9.1 9.0   PT/INR No results for input(s): LABPROT, INR in the last 72 hours. ABG No results for input(s): PHART, HCO3 in the last 72 hours.  Invalid input(s): PCO2, PO2  Studies/Results: No results found.  Anti-infectives: Anti-infectives    Start     Dose/Rate Route Frequency Ordered Stop   10/20/17 1054  ceFAZolin (ANCEF) 2-4 GM/100ML-% IVPB    Comments:  [name]   : cabinet override      10/20/17 1054 10/20/17 1245   10/20/17 1049  ceFAZolin (ANCEF) IVPB 2g/100 mL premix     2 g 200 mL/hr over 30 Minutes Intravenous On call to O.R. 10/20/17 1049 10/20/17 1315      Assessment/Plan: S/P repair incarcerated recurrent incisional hernia with mesh 10/29 - moderate ileus, back down to clears, antiemetics Anxiety - cannot take Xanax PO, add Ativan IV PRN MS - home neurologic protocol Deconditioning - post-op as well as MS related. PT/OT evals appreciated FEN - back  to clears as tol, BMET in AM, IVF VTE - Lovenox Dispo - ileus, DME ordered  LOS: 1 day    Daniel Chavez E 10/23/2017

## 2017-10-24 LAB — BASIC METABOLIC PANEL
Anion gap: 7 (ref 5–15)
BUN: 9 mg/dL (ref 6–20)
CO2: 27 mmol/L (ref 22–32)
Calcium: 9 mg/dL (ref 8.9–10.3)
Chloride: 105 mmol/L (ref 101–111)
Creatinine, Ser: 0.96 mg/dL (ref 0.61–1.24)
GFR calc Af Amer: >60 mL/min (ref >60)
GFR calc non Af Amer: >60 mL/min (ref >60)
Glucose, Bld: 114 mg/dL — ABNORMAL HIGH (ref 65–99)
Potassium: 3.9 mmol/L (ref 3.5–5.1)
Sodium: 139 mmol/L (ref 135–145)

## 2017-10-24 MED ORDER — BOOST / RESOURCE BREEZE PO LIQD
1.0000 | Freq: Three times a day (TID) | ORAL | Status: DC
  Administered 2017-10-24 – 2017-10-26 (×7): 1 via ORAL

## 2017-10-24 NOTE — Progress Notes (Signed)
4 Days Post-Op   Subjective/Chief Complaint: Passed some gas, no further emesis since yesterday PM   Objective: Vital signs in last 24 hours: Temp:  [98.2 F (36.8 C)-98.8 F (37.1 C)] 98.2 F (36.8 C) (11/02 0555) Pulse Rate:  [76-84] 76 (11/02 0555) Resp:  [18] 18 (11/02 0555) BP: (120-137)/(76-86) 120/76 (11/02 0555) SpO2:  [95 %-97 %] 95 % (11/02 0555) Last BM Date: 10/20/17  Intake/Output from previous day: 11/01 0701 - 11/02 0700 In: 2825.4 [P.O.:120; I.V.:2705.4] Out: 110 [Drains:110] Intake/Output this shift: No intake/output data recorded.  General appearance: cooperative Resp: clear to auscultation bilaterally GI: soft, dry stain on dressings, JPs SS  Lab Results:   Recent Labs  10/22/17 0618  WBC 9.7  HGB 12.3*  HCT 37.8*  PLT 222   BMET  Recent Labs  10/22/17 0618 10/24/17 0331  NA 138 139  K 3.9 3.9  CL 103 105  CO2 27 27  GLUCOSE 112* 114*  BUN 10 9  CREATININE 1.12 0.96  CALCIUM 9.0 9.0   PT/INR No results for input(s): LABPROT, INR in the last 72 hours. ABG No results for input(s): PHART, HCO3 in the last 72 hours.  Invalid input(s): PCO2, PO2  Studies/Results: No results found.  Anti-infectives: Anti-infectives    Start     Dose/Rate Route Frequency Ordered Stop   10/20/17 1054  ceFAZolin (ANCEF) 2-4 GM/100ML-% IVPB    Comments:  Daniel Chavez   : cabinet override      10/20/17 1054 10/20/17 1245   10/20/17 1049  ceFAZolin (ANCEF) IVPB 2g/100 mL premix     2 g 200 mL/hr over 30 Minutes Intravenous On call to O.R. 10/20/17 1049 10/20/17 1315      Assessment/Plan: S/P repair incarcerated recurrent incisional hernia with mesh 10/29 - moderate ileus now with some improvement Anxiety - Ativan IV PRN MS - home neurologic protocol Deconditioning - post-op as well as MS related. PT/OT evals appreciated FEN - fulls, add Breeze/Boost, lytes OK VTE - Lovenox Dispo - ileus improving, DME ordered  LOS: 2 days     Daniel Chavez E 10/24/2017

## 2017-10-25 MED ORDER — BISACODYL 10 MG RE SUPP
10.0000 mg | Freq: Every day | RECTAL | Status: DC | PRN
  Administered 2017-10-25: 10 mg via RECTAL
  Filled 2017-10-25: qty 1

## 2017-10-25 NOTE — Progress Notes (Signed)
5 Days Post-Op   Subjective/Chief Complaint: Passing flatus, no BM. No nausea. Pain well controlled.    Objective: Vital signs in last 24 hours: Temp:  [97.9 F (36.6 C)-98.3 F (36.8 C)] 97.9 F (36.6 C) (11/03 0520) Pulse Rate:  [62-85] 62 (11/03 0520) Resp:  [18] 18 (11/03 0520) BP: (114-141)/(78-96) 114/78 (11/03 0520) SpO2:  [94 %-96 %] 96 % (11/03 0520) Last BM Date: 10/20/17  Intake/Output from previous day: 11/02 0701 - 11/03 0700 In: 2293.8 [P.O.:700; I.V.:1573.8] Out: 35 [Drains:35] Intake/Output this shift: No intake/output data recorded.  General appearance: cooperative Resp: clear to auscultation bilaterally GI: soft, dry stain on dressings, JPs SS  Lab Results:  No results for input(s): WBC, HGB, HCT, PLT in the last 72 hours. BMET  Recent Labs  10/24/17 0331  NA 139  K 3.9  CL 105  CO2 27  GLUCOSE 114*  BUN 9  CREATININE 0.96  CALCIUM 9.0   PT/INR No results for input(s): LABPROT, INR in the last 72 hours. ABG No results for input(s): PHART, HCO3 in the last 72 hours.  Invalid input(s): PCO2, PO2  Studies/Results: No results found.  Anti-infectives: Anti-infectives    Start     Dose/Rate Route Frequency Ordered Stop   10/20/17 1054  ceFAZolin (ANCEF) 2-4 GM/100ML-% IVPB    Comments:  [name]   : cabinet override      10/20/17 1054 10/20/17 1245   10/20/17 1049  ceFAZolin (ANCEF) IVPB 2g/100 mL premix     2 g 200 mL/hr over 30 Minutes Intravenous On call to O.R. 10/20/17 1049 10/20/17 1315      Assessment/Plan: S/P repair incarcerated recurrent incisional hernia with mesh 10/29 - moderate ileus improving, await bowel movement Anxiety - Ativan IV PRN MS - home neurologic protocol Deconditioning - post-op as well as MS related. PT/OT evals appreciated FEN - fulls, add Breeze/Boost, lytes OK VTE - Lovenox Dispo - ileus improving, DME ordered, DC once has a bowel movement  LOS: 3 days    Clovis Riley 10/25/2017

## 2017-10-26 MED ORDER — DOCUSATE SODIUM 100 MG PO CAPS
100.0000 mg | ORAL_CAPSULE | Freq: Two times a day (BID) | ORAL | 0 refills | Status: DC
Start: 2017-10-26 — End: 2017-12-03

## 2017-10-26 MED ORDER — OXYCODONE HCL 5 MG PO TABS: 5.0000 mg | ORAL_TABLET | ORAL | 0 refills | Status: DC | PRN

## 2017-10-26 MED ORDER — PANTOPRAZOLE SODIUM 40 MG PO TBEC: 40.0000 mg | DELAYED_RELEASE_TABLET | Freq: Every day | ORAL | Status: DC

## 2017-10-26 NOTE — Discharge Summary (Signed)
Physician Discharge Summary  Patient ID: Daniel Chavez MRN: [id number] DOB/AGE: 1976/06/05 41 y.o.  Admit date: 10/20/2017 Discharge date: 10/26/2017  Admission Diagnoses: incisional hernia  Discharge Diagnoses:  Active Problems:   Incarcerated incisional hernia   Discharged Condition: good  Hospital Course: Admitted for supportive care following repair of recurrent incisional hernia with mesh 10/29. His diet was advanced and he was able to mobilize. His pain was well controlled. He was having bowel function, tolerating a diet, and mobilizing well on the day of discharge and was deemed stable for home.   Consults: None  Significant Diagnostic Studies: see epic  Treatments: surgery: as above  Discharge Exam: Blood pressure 116/80, pulse 67, temperature 98 F (36.7 C), temperature source Oral, resp. rate 18, height 6' 1"  (1.854 m), weight 117.9 kg (260 lb), SpO2 98 %. General appearance: alert and cooperative Resp: clear to auscultation bilaterally GI: soft, non-tender; bowel sounds normal; no masses,  no organomegaly Incision/Wound: clean, dry, intact with staples. JP drains x 2 with minimal serosanguinous output.   Disposition: 01-Home or Self Care   Allergies as of 10/26/2017   No Known Allergies     Medication List    STOP taking these medications   predniSONE 10 MG tablet Commonly known as:  DELTASONE     TAKE these medications   ALPRAZolam 0.5 MG tablet Commonly known as:  XANAX TAKE ONE TABLET BY MOUTH AT BEDTIME AS NEEDED FOR ANXIETY What changed:  See the new instructions.   buPROPion 150 MG 12 hr tablet Commonly known as:  WELLBUTRIN SR Take 1 tablet (150 mg total) by mouth daily.   docusate sodium 100 MG capsule Commonly known as:  COLACE Take 1 capsule (100 mg total) 2 (two) times daily by mouth.   erythromycin ophthalmic ointment Place into the left eye 4 (four) times daily. Place a 1/2 inch ribbon of ointment into the lower eyelid.    ibuprofen 200 MG tablet Commonly known as:  ADVIL,MOTRIN Take 400-600 mg by mouth every 8 (eight) hours as needed for headache or mild pain (depends on pain if takes 2-3 tablets).   meclizine 25 MG tablet Commonly known as:  ANTIVERT Take 1 tablet (25 mg total) by mouth 3 (three) times daily as needed for dizziness.   modafinil 200 MG tablet Commonly known as:  PROVIGIL Take 1 tablet (200 mg total) by mouth daily.   multivitamin with minerals Tabs tablet Take 1 tablet by mouth 2 (two) times a week.   natalizumab 300 MG/15ML injection Commonly known as:  TYSABRI Inject 300 mg into the vein every 28 (twenty-eight) days. Last injection was 09-29-17   ondansetron 4 MG tablet Commonly known as:  ZOFRAN Take 1 tablet (4 mg total) by mouth every 8 (eight) hours as needed for nausea or vomiting.   oxyCODONE 5 MG immediate release tablet Commonly known as:  Oxy IR/ROXICODONE Take 1-3 tablets (5-15 mg total) every 4 (four) hours as needed by mouth (44m for mild pain, 157mfor moderate pain, 1561mor severe pain).            Durable Medical Equipment  (From admission, onward)        Start     Ordered   10/22/17 1127  For home use only DME Walker rolling  Once    Question:  Patient needs a walker to treat with the following condition  Answer:  Incisional hernia   10/22/17 1128     Follow-up Information    [facility]  Lavone Neri, MD. Daphane Shepherd on 11/05/2017.   Specialty:  General Surgery Why:  Follow up as scheduled 11/05/17 12pm Contact information: [address] [place] [address] [place] [address] [phone]           Signed: Clovis Riley 10/26/2017, 10:57 AM

## 2017-10-26 NOTE — Progress Notes (Signed)
Pt for discharge going home with left and righ tJP drain, instructed how to empty the bag, prescriptions meds, health teaching, next appointment, discontinued peripheral IV line, no complained of pain at this time, given all his personal belongings.

## 2017-10-26 NOTE — Progress Notes (Signed)
6 Days Post-Op    CC: Recurrent incisional hernia  Subjective: Patient looks good waffle dressing in place.  Tolerating liquids well.  He did have a bowel movement.  He has 2 drains on each side of the incision with just bloody fluid in the bulbs.  He had a bowel movement yesterday.   Objective: Vital signs in last 24 hours: Temp:  [98 F (36.7 C)-99.1 F (37.3 C)] 98 F (36.7 C) (11/04 0429) Pulse Rate:  [67-81] 67 (11/04 0429) Resp:  [18] 18 (11/04 0429) BP: (116-139)/(80-93) 116/80 (11/04 0429) SpO2:  [95 %-100 %] 98 % (11/04 0429) Last BM Date: 10/24/17 720 PO Urine x 2 Drain 25 BM x 1 Afebrile, BP up some No labs   Intake/Output from previous day: 11/03 0701 - 11/04 0700 In: 720 [P.O.:720] Out: 25 [Drains:25] Intake/Output this shift: No intake/output data recorded.  General appearance: alert, cooperative and no distress Resp: clear to auscultation bilaterally GI: soft, sore, dressing intact, drains are old bloody drainage.  Lab Results:  No results for input(s): WBC, HGB, HCT, PLT in the last 72 hours.  BMET Recent Labs    10/24/17 0331  NA 139  K 3.9  CL 105  CO2 27  GLUCOSE 114*  BUN 9  CREATININE 0.96  CALCIUM 9.0   PT/INR No results for input(s): LABPROT, INR in the last 72 hours.  No results for input(s): AST, ALT, ALKPHOS, BILITOT, PROT, ALBUMIN in the last 168 hours.   Lipase  No results found for: LIPASE   Prior to Admission medications   Medication Sig Start Date End Date Taking? Authorizing Provider  ALPRAZolam (XANAX) 0.5 MG tablet TAKE ONE TABLET BY MOUTH AT BEDTIME AS NEEDED FOR ANXIETY Patient taking differently: TAKE ONE TABLET BY MOUTH AT BEDTIME AS NEEDED FOR SLEEP 08/19/17  Yes Sater, Nanine Means, MD  ibuprofen (ADVIL,MOTRIN) 200 MG tablet Take 400-600 mg by mouth every 8 (eight) hours as needed for headache or mild pain (depends on pain if takes 2-3 tablets).   Yes [provider]  natalizumab (TYSABRI) 300 MG/15ML  injection Inject 300 mg into the vein every 28 (twenty-eight) days. Last injection was 09-29-17   Yes [provider]  buPROPion (WELLBUTRIN SR) 150 MG 12 hr tablet Take 1 tablet (150 mg total) by mouth daily. Patient not taking: Reported on 10/10/2017 08/01/17   Kathrynn Ducking, MD  erythromycin ophthalmic ointment Place into the left eye 4 (four) times daily. Place a 1/2 inch ribbon of ointment into the lower eyelid. Patient not taking: Reported on 63/14/9702 6/37/85   Delora Fuel, MD  meclizine (ANTIVERT) 25 MG tablet Take 1 tablet (25 mg total) by mouth 3 (three) times daily as needed for dizziness. 09/15/17   Kathrynn Ducking, MD  modafinil (PROVIGIL) 200 MG tablet Take 1 tablet (200 mg total) by mouth daily. Patient not taking: Reported on 10/10/2017 08/05/17   Kathrynn Ducking, MD  Multiple Vitamin (MULTIVITAMIN WITH MINERALS) TABS tablet Take 1 tablet by mouth 2 (two) times a week.    [provider]  ondansetron (ZOFRAN) 4 MG tablet Take 1 tablet (4 mg total) by mouth every 8 (eight) hours as needed for nausea or vomiting. 09/10/17   Kathrynn Ducking, MD  predniSONE (DELTASONE) 10 MG tablet Begin taking 6 tablets daily, taper by one tablet every other day until off the medication. Patient not taking: Reported on 10/10/2017 08/26/17   Kathrynn Ducking, MD    Medications: . docusate sodium  100 mg Oral BID  . enoxaparin (LOVENOX) injection  40 mg Subcutaneous Q24H  . feeding supplement  1 Container Oral TID BM  . pantoprazole  40 mg Oral QHS    Assessment/Plan S/P repair  recurrent incisional hernia with mesh 10/29 - moderate ileus improving, await bowel movement Anxiety - Ativan IV PRN MS - home neurologic protocol Deconditioning - post-op as well as MS related. PT/OT evals appreciated FEN - fulls, add Breeze/Boost, lytes OK VTE - Lovenox Dispo - ileus improving, DME ordered, DC once has a bowel movement  Plan:  Soft diet and discuss discharge with Dr. Kae Heller.         LOS: 4 days    Daniel Chavez 10/26/2017 [phone]

## 2017-10-26 NOTE — Discharge Instructions (Signed)
[facility], [place] [phone]  OPEN ABDOMINAL SURGERY: POST OP INSTRUCTIONS  Always review your discharge instruction sheet given to you by the facility where your surgery was performed.  IF YOU HAVE DISABILITY OR FAMILY LEAVE FORMS, YOU MUST BRING THEM TO THE OFFICE FOR PROCESSING.  PLEASE DO NOT GIVE THEM TO YOUR DOCTOR.  1. A prescription for pain medication may be given to you upon discharge.  Take your pain medication as prescribed, if needed.  If narcotic pain medicine is not needed, then you may take acetaminophen (Tylenol) or ibuprofen (Advil) as needed. 2. Take your usually prescribed medications unless otherwise directed. 3. If you need a refill on your pain medication, please contact your pharmacy. They will contact our office to request authorization.  Prescriptions will not be filled after 5pm or on week-ends. 4. You should follow a light diet the first few days after arrival home, such as soup and crackers, pudding, etc.unless your doctor has advised otherwise. A high-fiber, low fat diet can be resumed as tolerated.   Be sure to include lots of fluids daily. Most patients will experience some swelling and bruising on the chest and neck area.  Ice packs will help.  Swelling and bruising can take several days to resolve 5. Most patients will experience some swelling and bruising in the area of the incision. Ice pack will help. Swelling and bruising can take several days to resolve..  6. It is common to experience some constipation if taking pain medication after surgery.  Increasing fluid intake and taking a stool softener will usually help or prevent this problem from occurring.  A mild laxative (Milk of Magnesia or Miralax) should be taken according to package directions if there are no bowel movements after 48 hours. 7.  You may have steri-strips (small skin tapes) in place directly over the incision.  These strips should be left on the skin for 7-10 days.  If your  surgeon used skin glue on the incision, you may shower in 24 hours.  The glue will flake off over the next 2-3 weeks.  Any sutures or staples will be removed at the office during your follow-up visit. You may find that a light gauze bandage over your incision may keep your staples from being rubbed or pulled. You may shower and replace the bandage daily. 8. ACTIVITIES:  You may resume regular (light) daily activities beginning the next day--such as daily self-care, walking, climbing stairs--gradually increasing activities as tolerated.  You may have sexual intercourse when it is comfortable.  Refrain from any heavy lifting or straining until approved by your doctor. a. You may drive when you no longer are taking prescription pain medication, you can comfortably wear a seatbelt, and you can safely maneuver your car and apply brakes b. Return to Work: ___________________________________ 8. You should see your doctor in the office for a follow-up appointment approximately 1 week after your surgery.  Make sure that you call for this appointment within a day or two after you arrive home to insure a convenient appointment time. OTHER INSTRUCTIONS: Empty JP drains daily and record output. Bring the record of output to your post-op appointment.  Wear abdominal binder when out of bed.  _____________________________________________________________ _____________________________________________________________  WHEN TO CALL YOUR DOCTOR: 1. Fever over 101.0 2. Inability to urinate 3. Nausea and/or vomiting 4. Extreme swelling or bruising 5. Continued bleeding from incision. 6. Increased pain, redness, or drainage from the incision. 7. Difficulty swallowing or  breathing 8. Muscle cramping or spasms. 9. Numbness or tingling in hands or feet or around lips.  The clinic staff is available to answer your questions during regular business hours.  Please dont hesitate to call and ask to speak to one of the nurses  if you have concerns.  For further questions, please visit [email or web address]

## 2017-10-30 ENCOUNTER — Telehealth: Payer: Self-pay | Admitting: Neurology

## 2017-10-30 ENCOUNTER — Encounter: Payer: Self-pay | Admitting: *Deleted

## 2017-10-30 ENCOUNTER — Other Ambulatory Visit: Payer: Self-pay | Admitting: *Deleted

## 2017-10-30 NOTE — Patient Outreach (Addendum)
[facility]) Care Management  10/30/2017  Daniel Chavez 02/26/1976 [id number]   Subjective: Telephone call to patient's home / mobile number, spoke with patient, and HIPAA verified.  Discussed [name] Care Management [employer] Transition of care follow up, patient voiced understanding, and is in agreement to follow up.   Patient states he is doing well, went to follow up surgery appointment this am, staples, and surgical drains were removed.   States appointment went well and has another follow up appointment on 11/05/17.  Patient voices understanding of medical diagnosis, surgery, and treatment plan. States he is accessing his [name] benefits as needed via member services number on back of card or through [email or web address]  Patient states he does not have any education material, transition of care, care coordination, disease management, disease monitoring, transportation, community resource, or pharmacy needs at this time. States he is very appreciative of the follow up and is in agreement to receive [name] information.     Objective:  Per KPN (Knowledge Performance Now, point of care tool), Cigna iCollaborate, and chart review, patient hospitalized 10/20/17 -11/05/17 for Incarcerated incisional hernia.   Status post REPAIR OF INCARCERATED RECURRENT INCISIONAL HERNIA WITH MESH, INSERTION OF MESH, and LYSIS OF ADHESIONS on 10/20/17.   Patient also has a history of multiple sclerosis.      Assessment: Received [employer] Transition of care referral on 10/28/17.  Transition of care follow up completed, no care management needs, and will proceed with case closure.      Plan: RNCM will send patient successful outreach letter, [name] pamphlet, and magnet. RNCM will send case closure due to follow up completed / no care management needs request to [facility].     Chijioke Lasser H. Annia Friendly, BSN, [place] Management [name] Telephonic CM Phone: [phone] Fax:  [phone]

## 2017-10-30 NOTE — Telephone Encounter (Signed)
I called the patient.  I see no problem with him getting the Tysabri infusion tomorrow.  He will call if he has any other questions.

## 2017-10-30 NOTE — Telephone Encounter (Signed)
Pt called is concerned about taking the blood thinner when he was in the hospital 10/29-11/4 for surgery and any medications he was on would not interfere with tysabri infusion tomorrow. He is only taking ibuprofen presently. Please call to advise

## 2017-11-05 ENCOUNTER — Telehealth: Payer: Self-pay | Admitting: *Deleted

## 2017-11-05 ENCOUNTER — Encounter: Payer: Self-pay | Admitting: Neurology

## 2017-11-05 NOTE — Telephone Encounter (Signed)
Pt [employer] form on [employer].

## 2017-11-06 NOTE — Telephone Encounter (Signed)
Letter written by CW,MD 11/05/17 faxed to Elmyra Ricks, appeal specialist with [facility]. Fax: [phone]. Received fax confirmation.   Called pt and scheduled f/u for 12/01/17 at 12pm, check in 1130am. Also advised we faxed letter to [facility]. He verbalized understanding and appreciation for call.

## 2017-12-01 ENCOUNTER — Ambulatory Visit: Payer: Self-pay | Admitting: Neurology

## 2017-12-03 ENCOUNTER — Ambulatory Visit (INDEPENDENT_AMBULATORY_CARE_PROVIDER_SITE_OTHER): Payer: Managed Care, Other (non HMO) | Admitting: Neurology

## 2017-12-03 ENCOUNTER — Encounter: Payer: Self-pay | Admitting: Neurology

## 2017-12-03 VITALS — BP 153/96 | HR 91 | Ht 73.0 in | Wt 265.0 lb

## 2017-12-03 DIAGNOSIS — Z5181 Encounter for therapeutic drug level monitoring: Secondary | ICD-10-CM | POA: Diagnosis not present

## 2017-12-03 DIAGNOSIS — G479 Sleep disorder, unspecified: Secondary | ICD-10-CM

## 2017-12-03 DIAGNOSIS — G35 Multiple sclerosis: Secondary | ICD-10-CM | POA: Diagnosis not present

## 2017-12-03 MED ORDER — ZOLPIDEM TARTRATE 5 MG PO TABS: 5.0000 mg | ORAL_TABLET | Freq: Every evening | ORAL | 3 refills | Status: DC | PRN

## 2017-12-03 MED ORDER — DIAZEPAM 2 MG PO TABS: 2.0000 mg | ORAL_TABLET | Freq: Two times a day (BID) | ORAL | 1 refills | Status: DC

## 2017-12-03 NOTE — Progress Notes (Signed)
Reason for visit: Multiple sclerosis  Daniel Chavez is an 41 y.o. male  History of present illness:  Daniel Chavez is a 41 year old left-handed white male with a history of multiple sclerosis.  The patient is on Tysabri, he is tolerating the medication well.  He is recovering from hernia surgery.  He continues to have problems with chronic dizziness unassociated with true vertigo.  He takes meclizine for this without much benefit.  He indicates that the dizziness is worse when he is up on his feet, touching a wall or table does not help the sensation.  The patient overall finds that he has a low energy level throughout the day.  He finds that he wakes up frequently at night, his wife indicates that he does snore.  The patient is out of work until the first of the year.  He denies issues controlling the bowels of the bladder, he reports no new numbness or weakness of the extremities or face.  He denies issues controlling the bowels of the bladder.  He believes that his cognitive function has improved.  He returns for an evaluation.  Past Medical History:  Diagnosis Date  . History of hiatal hernia   . MS (multiple sclerosis) ([place])   . Multiple sclerosis ([place]) 09/22/2013  . Obesity   . Optic neuritis, left     Past Surgical History:  Procedure Laterality Date  . HERNIA REPAIR     2005/2007 Left siide hiatal hernia  . INCISIONAL HERNIA REPAIR N/A 10/20/2017   Procedure: REPAIR OF RECURRENT INCISIONAL HERNIA WITH MESH;  Surgeon: Georganna Skeans, MD;  Location: [facility];  Service: General;  Laterality: N/A;  . INSERTION OF MESH N/A 10/20/2017   Procedure: INSERTION OF MESH;  Surgeon: Georganna Skeans, MD;  Location: [facility] OR;  Service: General;  Laterality: N/A;    Family History  Problem Relation Age of Onset  . Leukemia Sister     Social history:  reports that he quit smoking about 9 years ago. he has never used smokeless tobacco. He reports that he does not drink alcohol or use  drugs.   No Known Allergies  Medications:  Prior to Admission medications   Medication Sig Start Date End Date Taking? Authorizing Provider  ALPRAZolam (XANAX) 0.5 MG tablet TAKE ONE TABLET BY MOUTH AT BEDTIME AS NEEDED FOR ANXIETY Patient taking differently: TAKE ONE TABLET BY MOUTH AT BEDTIME AS NEEDED FOR SLEEP 08/19/17   Sater, Nanine Means, MD  buPROPion (WELLBUTRIN SR) 150 MG 12 hr tablet Take 1 tablet (150 mg total) by mouth daily. Patient not taking: Reported on 10/10/2017 08/01/17   Kathrynn Ducking, MD  docusate sodium (COLACE) 100 MG capsule Take 1 capsule (100 mg total) 2 (two) times daily by mouth. 10/26/17   Clovis Riley, MD  erythromycin ophthalmic ointment Place into the left eye 4 (four) times daily. Place a 1/2 inch ribbon of ointment into the lower eyelid. Patient not taking: Reported on 33/82/5053 9/76/73   Delora Fuel, MD  ibuprofen (ADVIL,MOTRIN) 200 MG tablet Take 400-600 mg by mouth every 8 (eight) hours as needed for headache or mild pain (depends on pain if takes 2-3 tablets).    [provider]  meclizine (ANTIVERT) 25 MG tablet Take 1 tablet (25 mg total) by mouth 3 (three) times daily as needed for dizziness. 09/15/17   Kathrynn Ducking, MD  modafinil (PROVIGIL) 200 MG tablet Take 1 tablet (200 mg total) by mouth daily. Patient not taking: Reported  on 10/10/2017 08/05/17   Kathrynn Ducking, MD  Multiple Vitamin (MULTIVITAMIN WITH MINERALS) TABS tablet Take 1 tablet by mouth 2 (two) times a week.    [provider]  natalizumab (TYSABRI) 300 MG/15ML injection Inject 300 mg into the vein every 28 (twenty-eight) days. Last injection was 09-29-17    [provider]  ondansetron (ZOFRAN) 4 MG tablet Take 1 tablet (4 mg total) by mouth every 8 (eight) hours as needed for nausea or vomiting. 09/10/17   Kathrynn Ducking, MD  oxyCODONE (OXY IR/ROXICODONE) 5 MG immediate release tablet Take 1-3 tablets (5-15 mg total) every 4 (four) hours as needed  by mouth (26m for mild pain, 17mfor moderate pain, 1544mor severe pain). 10/26/17   ConClovis RileyD    ROS:  Out of a complete 14 system review of symptoms, the patient complains only of the following symptoms, and all other reviewed systems are negative.  Frequent waking, daytime sleepiness Dizziness Speech difficulty, weakness  There were no vitals taken for this visit.  Physical Exam  General: The patient is alert and cooperative at the time of the examination.  Skin: No significant peripheral edema is noted.   Neurologic Exam  Mental status: The patient is alert and oriented x 3 at the time of the examination. The patient has apparent normal recent and remote memory, with an apparently normal attention span and concentration ability.   Cranial nerves: Facial symmetry is present. Speech is normal, no aphasia or dysarthria is noted. Extraocular movements are full. Visual fields are full.  Pupils are equal, round, and reactive to light.  Discs are flat bilaterally.  Motor: The patient has good strength in all 4 extremities.  Sensory examination: Soft touch sensation is symmetric on the face, arms, and legs.  Coordination: The patient has good finger-nose-finger and heel-to-shin bilaterally.  Gait and station: The patient has a normal gait. Tandem gait is normal. Romberg is negative. No drift is seen.  Reflexes: Deep tendon reflexes are symmetric.   Assessment/Plan:  1.  Multiple sclerosis  2.  Chronic dizziness  3.  Sleep disturbance  The patient will be given a prescription for a low-dose of Ambien to take at night for sleep.  He will be taken off of alprazolam, he will be placed on diazepam 2 mg twice daily for the dizziness, he will stop the meclizine.  If the diazepam is not effective, this also will be discontinued.  The patient will be set up for sleep evaluation.  He will have blood work done today.  He will follow-up in 6 months.  So far he is doing well  on Tysabri.  C. Jill Alexanders 12/03/2017 1:55 PM  [facility] [id number] [id number] [phone]

## 2017-12-03 NOTE — Progress Notes (Signed)
Placed [facility] lab in [facility] lock box for scheduled routine pick up. Faxed rx diazepam to [facility] at [phone]. Received fax confirmation.

## 2017-12-04 ENCOUNTER — Telehealth: Payer: Self-pay | Admitting: Neurology

## 2017-12-04 LAB — CBC WITH DIFFERENTIAL/PLATELET
Basophils Absolute: 0 10*3/uL (ref 0.0–0.2)
Basos: 1 %
EOS (ABSOLUTE): 0.1 10*3/uL (ref 0.0–0.4)
Eos: 2 %
Hematocrit: 39.7 % (ref 37.5–51.0)
Hemoglobin: 14 g/dL (ref 13.0–17.7)
Immature Grans (Abs): 0 10*3/uL (ref 0.0–0.1)
Immature Granulocytes: 1 %
Lymphocytes Absolute: 1.9 10*3/uL (ref 0.7–3.1)
Lymphs: 36 %
MCH: 30.7 pg (ref 26.6–33.0)
MCHC: 35.3 g/dL (ref 31.5–35.7)
MCV: 87 fL (ref 79–97)
Monocytes Absolute: 0.7 10*3/uL (ref 0.1–0.9)
Monocytes: 15 %
Neutrophils Absolute: 2.4 10*3/uL (ref 1.4–7.0)
Neutrophils: 45 %
Platelets: 228 10*3/uL (ref 150–379)
RBC: 4.56 x10E6/uL (ref 4.14–5.80)
RDW: 13.8 % (ref 12.3–15.4)
WBC: 5.1 10*3/uL (ref 3.4–10.8)

## 2017-12-04 LAB — COMPREHENSIVE METABOLIC PANEL
ALT: 16 IU/L (ref 0–44)
AST: 17 IU/L (ref 0–40)
Albumin/Globulin Ratio: 1.9 (ref 1.2–2.2)
Albumin: 4.5 g/dL (ref 3.5–5.5)
Alkaline Phosphatase: 67 IU/L (ref 39–117)
BUN/Creatinine Ratio: 14 (ref 9–20)
BUN: 13 mg/dL (ref 6–24)
Bilirubin Total: 0.4 mg/dL (ref 0.0–1.2)
CO2: 23 mmol/L (ref 20–29)
Calcium: 9.5 mg/dL (ref 8.7–10.2)
Chloride: 107 mmol/L — ABNORMAL HIGH (ref 96–106)
Creatinine, Ser: 0.93 mg/dL (ref 0.76–1.27)
GFR calc Af Amer: 116 mL/min/{1.73_m2} (ref >59)
GFR calc non Af Amer: 100 mL/min/{1.73_m2} (ref >59)
Globulin, Total: 2.4 g/dL (ref 1.5–4.5)
Glucose: 99 mg/dL (ref 65–99)
Potassium: 4.5 mmol/L (ref 3.5–5.2)
Sodium: 144 mmol/L (ref 134–144)
Total Protein: 6.9 g/dL (ref 6.0–8.5)

## 2017-12-04 NOTE — Telephone Encounter (Signed)
Pt called he is wanting clarification as to what the diazepam (VALIUM) 2 MG tablet .Please call to advise

## 2017-12-04 NOTE — Telephone Encounter (Signed)
Called pt back. He wanted to know what diazepam was for. He could not remember. I advised per CW,MD note that it was for his dizziness. He an take 1 tablet BID. He verbalized understanding.   Also relayed lab results per CW,MD note. Unremarkable except mild elevation of chloride, not clinically significant. He verbalized understanding.

## 2017-12-05 ENCOUNTER — Telehealth: Payer: Self-pay | Admitting: Neurology

## 2017-12-05 NOTE — Telephone Encounter (Signed)
I called the patient.  The patient has only been on diazepam 1 day.  I would probably give it a little bit more time, if it is not effective may double the dose to 4 mg twice daily.  It is possible that his dizziness is of central origin and may not be very treatable.

## 2017-12-05 NOTE — Telephone Encounter (Signed)
Pt called to inform that the diazepam (VALIUM) 2 MG tablet is not helping with his dizzyness, he is asking if something stronger can be called in for him.  Pt still using [facility] [address] [place] [phone] (Phone) [phone] (Fax)

## 2017-12-10 ENCOUNTER — Telehealth: Payer: Self-pay | Admitting: Neurology

## 2017-12-10 NOTE — Telephone Encounter (Signed)
JC viral antibody panel was positive, index was 0.31.  We will continue the patient on Tysabri, as long as index remains low, we can use the medication for up to 2 years.

## 2017-12-26 ENCOUNTER — Encounter (INDEPENDENT_AMBULATORY_CARE_PROVIDER_SITE_OTHER): Payer: Self-pay

## 2017-12-26 ENCOUNTER — Telehealth: Payer: Self-pay | Admitting: *Deleted

## 2017-12-26 NOTE — Telephone Encounter (Signed)
Patient in office today for IV Tysabri infusion. He and his wife wanted to go over lab results again. Relayed labs results per last note on 12/04/17. Advised [facility]CV positive and index was 0.31. Will continue Tysabri as long as index remains low, we can use medication for up to two years. Pt/wife verbalized understanding and appreciation.

## 2018-01-07 ENCOUNTER — Institutional Professional Consult (permissible substitution): Payer: Managed Care, Other (non HMO) | Admitting: Neurology

## 2018-01-15 ENCOUNTER — Telehealth: Payer: Self-pay | Admitting: Neurology

## 2018-01-15 MED ORDER — ZOLPIDEM TARTRATE 10 MG PO TABS: 10.0000 mg | ORAL_TABLET | Freq: Every evening | ORAL | 2 refills | Status: DC | PRN

## 2018-01-15 NOTE — Telephone Encounter (Signed)
I called the patient.  The patient indicates that he takes Ambien only once or twice a week when he has difficulty sleeping.  I will increase the milligram strength to 10 mg at night.  If he is having to take the medication more frequently, we may switch over to a medication such as trazodone that he takes chronically at night for sleep.

## 2018-01-15 NOTE — Telephone Encounter (Signed)
Pt is wanting to know if he can have a higher dosage for zolpidem (AMBIEN) 5 MG tablet sent to [facility] [address], [place]

## 2018-01-16 NOTE — Telephone Encounter (Signed)
Faxed printed/signed rx Ambien 35m tablet to [facility] [phone] Received fax confirmation.

## 2018-02-10 ENCOUNTER — Telehealth: Payer: Self-pay | Admitting: *Deleted

## 2018-02-10 NOTE — Telephone Encounter (Signed)
Received fax notification from [place] touch prescribing program that patient authorization valid from 03/10/2018-09/08/2018. Patient enrollment number: [id number]. Account: [employer]. Site auth number: [id number].

## 2018-02-10 NOTE — Telephone Encounter (Signed)
Faxed completed/signed Tysabri pt status report and reauth questionnaire to [place] touch prescribing program at [phone]. Received confirmation.

## 2018-02-17 ENCOUNTER — Other Ambulatory Visit: Payer: Self-pay | Admitting: Neurology

## 2018-02-18 ENCOUNTER — Other Ambulatory Visit: Payer: Self-pay | Admitting: Neurology

## 2018-02-18 NOTE — Telephone Encounter (Signed)
Faxed printed/signed rx modafinil to [facility] at [phone]. Received fax confirmation.

## 2018-02-20 ENCOUNTER — Telehealth: Payer: Self-pay | Admitting: Neurology

## 2018-02-20 ENCOUNTER — Encounter: Payer: Self-pay | Admitting: Neurology

## 2018-02-20 ENCOUNTER — Other Ambulatory Visit: Payer: Self-pay | Admitting: Neurology

## 2018-02-20 MED ORDER — DIAZEPAM 2 MG PO TABS: 2.0000 mg | ORAL_TABLET | Freq: Four times a day (QID) | ORAL | 5 refills | Status: DC | PRN

## 2018-02-20 NOTE — Telephone Encounter (Signed)
A letter was written regarding the cooling vest.

## 2018-02-20 NOTE — Telephone Encounter (Signed)
Pt here today for introfusion - requesting letter stating he was diagnosed with MS so he can get a cooling vest. Best call back  [phone]

## 2018-02-24 ENCOUNTER — Telehealth: Payer: Self-pay | Admitting: Neurology

## 2018-02-24 MED ORDER — DIAZEPAM 2 MG PO TABS: 2.0000 mg | ORAL_TABLET | Freq: Four times a day (QID) | ORAL | 0 refills | Status: DC | PRN

## 2018-02-24 NOTE — Telephone Encounter (Signed)
Pt called he has increased diazepam (VALIUM) 2 MG tablet to 23m every 6 hours for the past 2 mths which has helped with the vertigo. He is asking for a script to be sent to [facility] He is aware a script was sent for 240mevery 6 hrs. Please call to advise.

## 2018-02-24 NOTE — Telephone Encounter (Signed)
I called the patient.  The diazepam was increased from 2 mg 3 times a day to 2 mg 4 times a day, the patient seems to be okay with this increase.

## 2018-03-20 ENCOUNTER — Telehealth: Payer: Self-pay | Admitting: Neurology

## 2018-03-20 NOTE — Telephone Encounter (Signed)
Pt called wanting to know if he can take tamiflu along with having his infusion this morning

## 2018-03-20 NOTE — Telephone Encounter (Signed)
I called the patient.  The patient's child has influenza B, he has been given a prescription for Tamiflu a months okay to take this, I have no problems with him using the medication for several days.

## 2018-05-26 ENCOUNTER — Other Ambulatory Visit: Payer: Self-pay | Admitting: Neurology

## 2018-05-26 NOTE — Telephone Encounter (Signed)
Rx registry checked. Last fill date 04/25/18 for #30. Last OV 12/03/17, next OV 06/08/18.

## 2018-05-27 NOTE — Telephone Encounter (Signed)
Faxed printed/signed rx modafinil to [facility] at [phone]. Received fax confirmation.

## 2018-06-08 ENCOUNTER — Ambulatory Visit (INDEPENDENT_AMBULATORY_CARE_PROVIDER_SITE_OTHER): Payer: Managed Care, Other (non HMO) | Admitting: Neurology

## 2018-06-08 ENCOUNTER — Encounter: Payer: Self-pay | Admitting: Neurology

## 2018-06-08 VITALS — BP 126/87 | HR 90 | Ht 73.0 in | Wt 278.5 lb

## 2018-06-08 DIAGNOSIS — G35 Multiple sclerosis: Secondary | ICD-10-CM

## 2018-06-08 DIAGNOSIS — Z5181 Encounter for therapeutic drug level monitoring: Secondary | ICD-10-CM | POA: Diagnosis not present

## 2018-06-08 NOTE — Patient Instructions (Signed)
Reduce the diazepam 2 mg tablet to one three times a day for 2 weeks, then take one twice a day for 2 weeks, then take one a day for 2 weeks, then stop.

## 2018-06-08 NOTE — Progress Notes (Signed)
Placed [facility] lab in [facility] lock box for routine pick up.

## 2018-06-08 NOTE — Progress Notes (Signed)
Reason for visit: Multiple sclerosis  Daniel Chavez is an 42 y.o. male  History of present illness:  Mr. Daniel Chavez is a 42 year old left-handed white male with a history of multiple sclerosis.  The patient is on Tysabri, he has a positive JC virus antibody panel in the low titer.  He has been on Tysabri since August 2018.  The patient is doing well with this, he has been stable with his multiple sclerosis.  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  He has a lot of fatigue, but he takes Provigil for this and this seems to help him significantly.  The patient reports that he may have some slightly increased slurred speech.  He takes diazepam 2 mg 4 times a day but he does not believe this helps the dizziness.  He has not had any falls, his balance is slightly off.  He returns for an evaluation.  The patient has slight intermittent numbness of the right hand.  Past Medical History:  Diagnosis Date  . History of hiatal hernia   . MS (multiple sclerosis) ([place])   . Multiple sclerosis ([place]) 09/22/2013  . Obesity   . Optic neuritis, left     Past Surgical History:  Procedure Laterality Date  . HERNIA REPAIR     2005/2007 Left siide hiatal hernia  . INCISIONAL HERNIA REPAIR N/A 10/20/2017   Procedure: REPAIR OF RECURRENT INCISIONAL HERNIA WITH MESH;  Surgeon: Georganna Skeans, MD;  Location: [facility];  Service: General;  Laterality: N/A;  . INSERTION OF MESH N/A 10/20/2017   Procedure: INSERTION OF MESH;  Surgeon: Georganna Skeans, MD;  Location: [facility] OR;  Service: General;  Laterality: N/A;    Family History  Problem Relation Age of Onset  . Leukemia Sister     Social history:  reports that he quit smoking about 9 years ago. He has never used smokeless tobacco. He reports that he does not drink alcohol or use drugs.   No Known Allergies  Medications:  Prior to Admission medications   Medication Sig Start Date End Date Taking? Authorizing Provider    diazepam (VALIUM) 2 MG tablet Take 1 tablet (2 mg total) by mouth every 6 (six) hours as needed for anxiety. 02/24/18  Yes Kathrynn Ducking, MD  ibuprofen (ADVIL,MOTRIN) 200 MG tablet Take 400-600 mg by mouth every 8 (eight) hours as needed for headache or mild pain (depends on pain if takes 2-3 tablets).   Yes [provider]  meclizine (ANTIVERT) 25 MG tablet Take 1 tablet (25 mg total) by mouth 3 (three) times daily as needed for dizziness. 09/15/17  Yes Kathrynn Ducking, MD  modafinil (PROVIGIL) 200 MG tablet TAKE ONE TABLET BY MOUTH DAILY 05/27/18  Yes Kathrynn Ducking, MD  Multiple Vitamin (MULTIVITAMIN WITH MINERALS) TABS tablet Take 1 tablet by mouth 2 (two) times a week.   Yes [provider]  natalizumab (TYSABRI) 300 MG/15ML injection Inject 300 mg into the vein every 28 (twenty-eight) days. Last injection was 09-29-17   Yes [provider]  zolpidem (AMBIEN) 10 MG tablet Take 1 tablet (10 mg total) by mouth at bedtime as needed for sleep. 01/15/18 02/14/18  Kathrynn Ducking, MD    ROS:  Out of a complete 14 system review of symptoms, the patient complains only of the following symptoms, and all other reviewed systems are negative.  Fatigue Light sensitivity Urgency of the bladder Dizziness, headache, numbness, speech difficulty, weakness  Blood pressure 126/87, pulse 90, height 6' 1"  (1.854 m), weight 278 lb 8 oz (126.3 kg).  Physical Exam  General: The patient is alert and cooperative at the time of the examination.  The patient is moderately to markedly obese.  Skin: No significant peripheral edema is noted.   Neurologic Exam  Mental status: The patient is alert and oriented x 3 at the time of the examination. The patient has apparent normal recent and remote memory, with an apparently normal attention span and concentration ability.   Cranial nerves: Facial symmetry is present. Speech is slightly dysarthric, not aphasic. Extraocular movements  are full. Visual fields are full.  Motor: The patient has good strength in all 4 extremities.  Sensory examination: Soft touch sensation is symmetric on the face, arms, and legs.  Coordination: The patient has good finger-nose-finger and heel-to-shin bilaterally.  Gait and station: The patient has a normal gait. Tandem gait is normal. Romberg is negative. No drift is seen.  Reflexes: Deep tendon reflexes are symmetric.   Assessment/Plan:  1.  Multiple sclerosis  2.  Chronic dizziness  The patient will be set up for blood work today.  He will continue Tysabri for now, we will plan on keeping him on this medication for a total of 2 years and then consider a switch off to another medication.  The patient will have MRI of the brain and cervical spine.  He will be tapered off of the diazepam as this does not seem to help his dizziness.  He will go down by 2 mg every 2 weeks until off the drug.  Jill Alexanders MD 06/08/2018 4:22 PM  [facility] [address] [place], [place] [address]  Phone [phone] Fax [phone]

## 2018-06-09 ENCOUNTER — Telehealth: Payer: Self-pay | Admitting: Neurology

## 2018-06-09 LAB — COMPREHENSIVE METABOLIC PANEL
ALT: 29 IU/L (ref 0–44)
AST: 22 IU/L (ref 0–40)
Albumin/Globulin Ratio: 2.4 — ABNORMAL HIGH (ref 1.2–2.2)
Albumin: 4.7 g/dL (ref 3.5–5.5)
Alkaline Phosphatase: 72 IU/L (ref 39–117)
BUN/Creatinine Ratio: 13 (ref 9–20)
BUN: 14 mg/dL (ref 6–24)
Bilirubin Total: 0.5 mg/dL (ref 0.0–1.2)
CO2: 26 mmol/L (ref 20–29)
Calcium: 10 mg/dL (ref 8.7–10.2)
Chloride: 107 mmol/L — ABNORMAL HIGH (ref 96–106)
Creatinine, Ser: 1.05 mg/dL (ref 0.76–1.27)
GFR calc Af Amer: 99 mL/min/{1.73_m2} (ref >59)
GFR calc non Af Amer: 86 mL/min/{1.73_m2} (ref >59)
Globulin, Total: 2 g/dL (ref 1.5–4.5)
Glucose: 83 mg/dL (ref 65–99)
Potassium: 4.7 mmol/L (ref 3.5–5.2)
Sodium: 148 mmol/L — ABNORMAL HIGH (ref 134–144)
Total Protein: 6.7 g/dL (ref 6.0–8.5)

## 2018-06-09 LAB — CBC WITH DIFFERENTIAL/PLATELET
Basophils Absolute: 0 10*3/uL (ref 0.0–0.2)
Basos: 1 %
EOS (ABSOLUTE): 0.1 10*3/uL (ref 0.0–0.4)
Eos: 1 %
Hematocrit: 43 % (ref 37.5–51.0)
Hemoglobin: 14.9 g/dL (ref 13.0–17.7)
Immature Grans (Abs): 0 10*3/uL (ref 0.0–0.1)
Immature Granulocytes: 0 %
Lymphocytes Absolute: 2.3 10*3/uL (ref 0.7–3.1)
Lymphs: 30 %
MCH: 30.8 pg (ref 26.6–33.0)
MCHC: 34.7 g/dL (ref 31.5–35.7)
MCV: 89 fL (ref 79–97)
Monocytes Absolute: 0.5 10*3/uL (ref 0.1–0.9)
Monocytes: 7 %
Neutrophils Absolute: 4.8 10*3/uL (ref 1.4–7.0)
Neutrophils: 61 %
Platelets: 225 10*3/uL (ref 150–450)
RBC: 4.84 x10E6/uL (ref 4.14–5.80)
RDW: 14.5 % (ref 12.3–15.4)
WBC: 7.8 10*3/uL (ref 3.4–10.8)

## 2018-06-09 NOTE — Telephone Encounter (Signed)
[employer] order sent to [facility]. They will obtain the auth and will reach out to the pt to schedule.

## 2018-06-11 ENCOUNTER — Other Ambulatory Visit: Payer: Self-pay | Admitting: Neurology

## 2018-06-12 ENCOUNTER — Telehealth: Payer: Self-pay | Admitting: Neurology

## 2018-06-12 NOTE — Telephone Encounter (Signed)
[name]: [id number] (exp. 06/11/18 to 09/09/18) patient is scheduled at GI for 06/14/18.

## 2018-06-12 NOTE — Telephone Encounter (Signed)
Pt is asking for a call to discuss his blood test result levels.

## 2018-06-12 NOTE — Telephone Encounter (Signed)
Dr. Jannifer Franklin' result note for pt's lab work on 06/08/18 is as follows: "Blood work is unremarkable with exception that the sodium and chloride levels are slightly elevated, recommend more aggressive hydration during the day. The JC viral antibody panel is pending."  I called pt and explained this to him. He knows that he is not adequately hydrated during the day and will work on this. He understands that the JCV is still pending. Pt verbalized understanding of results. Pt had no questions at this time but was encouraged to call back if questions arise.

## 2018-06-14 ENCOUNTER — Other Ambulatory Visit: Payer: Managed Care, Other (non HMO)

## 2018-06-14 ENCOUNTER — Inpatient Hospital Stay: Admission: RE | Admit: 2018-06-14 | Payer: Managed Care, Other (non HMO) | Source: Ambulatory Visit

## 2018-06-16 ENCOUNTER — Telehealth: Payer: Self-pay | Admitting: Neurology

## 2018-06-16 NOTE — Telephone Encounter (Signed)
JC virus antibody was positive, very low titer of 0.30, okay to continue Tysabri for now.

## 2018-06-23 ENCOUNTER — Ambulatory Visit
Admission: RE | Admit: 2018-06-23 | Discharge: 2018-06-23 | Disposition: A | Discharge status: Home / Self Care | Payer: Managed Care, Other (non HMO) | Source: Ambulatory Visit | Attending: Neurology | Admitting: Neurology

## 2018-06-23 DIAGNOSIS — G35 Multiple sclerosis: Secondary | ICD-10-CM

## 2018-06-23 MED ORDER — GADOBENATE DIMEGLUMINE 529 MG/ML IV SOLN
20.0000 mL | Freq: Once | INTRAVENOUS | Status: AC | PRN
  Administered 2018-06-23: 20 mL via INTRAVENOUS

## 2018-06-26 ENCOUNTER — Telehealth: Payer: Self-pay | Admitting: Neurology

## 2018-06-26 NOTE — Telephone Encounter (Signed)
I called the patient.  MRI of the brain shows no new lesions, May the lesions have actually decreased in size.  MRI of the cervical spine remained stable, I discussed this with the patient.   MRI brain 06/23/18:  IMPRESSION:   Abnormal MRI brain (with and without) demonstrating: 1. Multiple supratentorial and infratentorial chronic demyelinating plaques. Some of these are hypointense on T1 views. 2. No abnormal lesions on post-contrast views.  3. Compared to MRI on 08/03/17, some the plaques are have decreased in size. No definite new plaques are seen.    MRI cervical 06/23/18:  Abnormal MRI cervical spine (with and without) demonstrating: 1. Chronic demyelinating plaques at C3, C5, C5-6. T1. 2. No acute plaques. 3. At C2-3: uncovertebral joint hypertrophy with moderate right foraminal stenosis  4. At C4-5: uncovertebral joint hypertrophy and facet hypertrophy with moderate right foraminal stenosis  5. Compared to MRI on 08/03/17, no significant change.

## 2018-07-21 ENCOUNTER — Telehealth: Payer: Self-pay | Admitting: *Deleted

## 2018-07-21 NOTE — Telephone Encounter (Signed)
Received fax notification from [facility] that pt is enrolled in the $0 copay program for Tysabri. If the pt has any changes to therapy, insurance status or medical condition, they are required to contact the program so re-determination can be performed.  Questions: [phone].

## 2018-08-05 NOTE — Telephone Encounter (Signed)
Pt is wanting to know the test results. Said he was not contacted with the results. He can be reached at [phone]

## 2018-08-05 NOTE — Telephone Encounter (Signed)
I called and spoke with patient and made him aware of his JC virus results.

## 2018-08-10 ENCOUNTER — Telehealth: Payer: Self-pay | Admitting: *Deleted

## 2018-08-10 ENCOUNTER — Encounter: Payer: Self-pay | Admitting: *Deleted

## 2018-08-10 NOTE — Telephone Encounter (Signed)
Faxed completed/signed Tysabri pt status report and reauth questionnaire to [place] touch prescribing program at [phone]. Received confirmation.

## 2018-09-04 ENCOUNTER — Telehealth: Payer: Self-pay | Admitting: *Deleted

## 2018-09-04 ENCOUNTER — Other Ambulatory Visit: Payer: Self-pay | Admitting: Neurology

## 2018-09-04 MED ORDER — MECLIZINE HCL 25 MG PO TABS: 25.0000 mg | ORAL_TABLET | Freq: Four times a day (QID) | ORAL | 3 refills | Status: DC | PRN

## 2018-09-04 NOTE — Telephone Encounter (Signed)
Submitted PA Modafinil 252m tab on covermymeds. Key: [id number]. Waiting on determination.   "Your information has been submitted and will be reviewed by [place] You may close this dialog, return to your dashboard, and perform other tasks. An electronic determination will be received in CoverMyMeds within 72-120 hours. You can see the latest determination by locating this request on your dashboard or by reopening this request. You will receive a fax copy of the determination. If CChristella Scheuermannhas not responded in 120 hours, contact [employer] at [phone]"

## 2018-09-07 NOTE — Telephone Encounter (Signed)
PA Case: [id number], Status: Approved, Coverage Starts on: 09/04/2018 12:00:00 AM, Coverage Ends on: 09/04/2019 12:00:00 AM.  Faxed notice of approval to [employer] [employer] at [phone]. Received fax confirmation.

## 2018-10-01 ENCOUNTER — Other Ambulatory Visit: Payer: Self-pay | Admitting: Neurology

## 2018-10-02 NOTE — Telephone Encounter (Signed)
Pt is due for a refill on ambien and is up to date on his appts. [place] Drug Registry checked. Will send to Dr. Jannifer Franklin for review.

## 2018-12-01 ENCOUNTER — Other Ambulatory Visit: Payer: Self-pay | Admitting: Neurology

## 2018-12-02 NOTE — Telephone Encounter (Signed)
Dr. Jannifer Franklin, please advise if ok to refill Provigil 200 mg.  Last ov was 06/08/2018 and next ov 12/07/2018.  Drug registry checked, last pick up was 11/02/2018 #30 prescribed by our office.

## 2018-12-07 ENCOUNTER — Encounter: Payer: Self-pay | Admitting: Neurology

## 2018-12-07 ENCOUNTER — Telehealth: Payer: Self-pay

## 2018-12-07 ENCOUNTER — Ambulatory Visit (INDEPENDENT_AMBULATORY_CARE_PROVIDER_SITE_OTHER): Payer: Managed Care, Other (non HMO) | Admitting: Neurology

## 2018-12-07 VITALS — BP 140/85 | HR 97 | Ht 73.0 in | Wt 300.0 lb

## 2018-12-07 DIAGNOSIS — G35 Multiple sclerosis: Secondary | ICD-10-CM | POA: Diagnosis not present

## 2018-12-07 DIAGNOSIS — Z5181 Encounter for therapeutic drug level monitoring: Secondary | ICD-10-CM | POA: Diagnosis not present

## 2018-12-07 MED ORDER — MIRABEGRON ER 25 MG PO TB24: 25.0000 mg | ORAL_TABLET | Freq: Every day | ORAL | 3 refills | Status: DC

## 2018-12-07 NOTE — Telephone Encounter (Signed)
JCV lab placed in lock box for routine pickup.  MB RN

## 2018-12-07 NOTE — Progress Notes (Signed)
Reason for visit: Multiple sclerosis  Daniel Chavez is an 42 y.o. male  History of present illness:  Daniel Chavez is a 43 year old left-handed white male with a history of multiple sclerosis.  The patient has been on Tysabri, he has tolerated the medication quite well.  The patient has been on Tysabri about 18 months, he reports no new issues such as numbness, weakness, balance changes, or cognitive changes.  The patient is continuing to have chronic daily dizziness, he reports no falls.  Benzodiazepines seem to help this some.  The patient is continuing to work.  He denies any vision changes.  He returns to this office for an evaluation.  His JC viral antibody panel has been positive, but in very low titer.  We have planned on using Tysabri for 2 years and then switching over to another agent.   Past Medical History:  Diagnosis Date  . History of hiatal hernia   . MS (multiple sclerosis) ([place])   . Multiple sclerosis ([place]) 09/22/2013  . Obesity   . Optic neuritis, left     Past Surgical History:  Procedure Laterality Date  . HERNIA REPAIR     2005/2007 Left siide hiatal hernia  . INCISIONAL HERNIA REPAIR N/A 10/20/2017   Procedure: REPAIR OF RECURRENT INCISIONAL HERNIA WITH MESH;  Surgeon: Georganna Skeans, MD;  Location: [facility];  Service: General;  Laterality: N/A;  . INSERTION OF MESH N/A 10/20/2017   Procedure: INSERTION OF MESH;  Surgeon: Georganna Skeans, MD;  Location[facility] OR;  Service: General;  Laterality: N/A;    Family History  Problem Relation Age of Onset  . Leukemia Sister     Social history:  reports that he quit smoking about 10 years ago. He has never used smokeless tobacco. He reports that he does not drink alcohol or use drugs.   No Known Allergies  Medications:  Prior to Admission medications   Medication Sig Start Date End Date Taking? Authorizing Provider  ibuprofen (ADVIL,MOTRIN) 200 MG tablet Take 400-600 mg by mouth every 8 (eight) hours as needed for  headache or mild pain (depends on pain if takes 2-3 tablets).   Yes [provider]  meclizine (ANTIVERT) 25 MG tablet Take 1 tablet (25 mg total) by mouth 4 (four) times daily as needed for dizziness. 09/04/18  Yes Kathrynn Ducking, MD  modafinil (PROVIGIL) 200 MG tablet TAKE ONE TABLET BY MOUTH DAILY 12/02/18  Yes Kathrynn Ducking, MD  Multiple Vitamin (MULTIVITAMIN WITH MINERALS) TABS tablet Take 1 tablet by mouth 2 (two) times a week.   Yes [provider]  natalizumab (TYSABRI) 300 MG/15ML injection Inject 300 mg into the vein every 28 (twenty-eight) days. Last injection was 09-29-17   Yes [provider]  zolpidem (AMBIEN) 10 MG tablet TAKE ONE TABLET BY MOUTH AT BEDTIME AS NEEDED FOR SLEEP 10/02/18  Yes Kathrynn Ducking, MD    ROS:  Out of a complete 14 system review of symptoms, the patient complains only of the following symptoms, and all other reviewed systems are negative.  Cold intolerance, heat intolerance Frequency of urination, urinary urgency Dizziness, numbness, weakness  Blood pressure 140/85, pulse 97, height 6' 1"  (1.854 m), weight 300 lb (136.1 kg).  Physical Exam  General: The patient is alert and cooperative at the time of the examination.  The patient is moderately to markedly obese.  Skin: No significant peripheral edema is noted.   Neurologic Exam  Mental status: The patient is alert  and oriented x 3 at the time of the examination. The patient has apparent normal recent and remote memory, with an apparently normal attention span and concentration ability.   Cranial nerves: Facial symmetry is present. Speech is normal, no aphasia or dysarthria is noted. Extraocular movements are full. Visual fields are full.  Pupils are equal, round, and reactive to light.  Discs are flat bilaterally.  Motor: The patient has good strength in all 4 extremities.  Sensory examination: Soft touch sensation is symmetric on the face, arms, and  legs.  Coordination: The patient has good finger-nose-finger and heel-to-Daniel bilaterally.  Gait and station: The patient has a normal gait. Tandem gait is minimally unsteady. Romberg is negative. No drift is seen.  Reflexes: Deep tendon reflexes are symmetric.   Assessment/Plan:  1.  Multiple sclerosis  The patient is reporting some troubles with urinary frequency during the daytime, not at night.  We will try Myrbetriq for this.  The patient does have some mild cognitive issues, I hesitate to use Detrol or oxybutynin.  The patient will continue to stop it for now, he will follow-up in 6 months, we will consider a switch over to [place] or possibly even [place] on the next visit.  MRI of the brain will be done on the next visit.  Blood work will be done today.  Jill Alexanders MD 12/07/2018 4:19 PM  [facility] [address] [place], [place] [address]  Phone [phone] Fax [phone]

## 2018-12-08 ENCOUNTER — Telehealth: Payer: Self-pay

## 2018-12-08 LAB — COMPREHENSIVE METABOLIC PANEL
ALT: 27 IU/L (ref 0–44)
AST: 20 IU/L (ref 0–40)
Albumin/Globulin Ratio: 2.1 (ref 1.2–2.2)
Albumin: 4.5 g/dL (ref 3.5–5.5)
Alkaline Phosphatase: 69 IU/L (ref 39–117)
BUN/Creatinine Ratio: 13 (ref 9–20)
BUN: 15 mg/dL (ref 6–24)
Bilirubin Total: 0.3 mg/dL (ref 0.0–1.2)
CO2: 23 mmol/L (ref 20–29)
Calcium: 9.6 mg/dL (ref 8.7–10.2)
Chloride: 106 mmol/L (ref 96–106)
Creatinine, Ser: 1.12 mg/dL (ref 0.76–1.27)
GFR calc Af Amer: 92 mL/min/{1.73_m2} (ref >59)
GFR calc non Af Amer: 79 mL/min/{1.73_m2} (ref >59)
Globulin, Total: 2.1 g/dL (ref 1.5–4.5)
Glucose: 116 mg/dL — ABNORMAL HIGH (ref 65–99)
Potassium: 4.1 mmol/L (ref 3.5–5.2)
Sodium: 145 mmol/L — ABNORMAL HIGH (ref 134–144)
Total Protein: 6.6 g/dL (ref 6.0–8.5)

## 2018-12-08 LAB — CBC WITH DIFFERENTIAL/PLATELET
Basophils Absolute: 0.1 10*3/uL (ref 0.0–0.2)
Basos: 1 %
EOS (ABSOLUTE): 0.1 10*3/uL (ref 0.0–0.4)
Eos: 2 %
Hematocrit: 42 % (ref 37.5–51.0)
Hemoglobin: 14.8 g/dL (ref 13.0–17.7)
Immature Grans (Abs): 0 10*3/uL (ref 0.0–0.1)
Immature Granulocytes: 1 %
Lymphocytes Absolute: 2.2 10*3/uL (ref 0.7–3.1)
Lymphs: 35 %
MCH: 30.6 pg (ref 26.6–33.0)
MCHC: 35.2 g/dL (ref 31.5–35.7)
MCV: 87 fL (ref 79–97)
Monocytes Absolute: 0.5 10*3/uL (ref 0.1–0.9)
Monocytes: 9 %
Neutrophils Absolute: 3.3 10*3/uL (ref 1.4–7.0)
Neutrophils: 52 %
Platelets: 242 10*3/uL (ref 150–450)
RBC: 4.84 x10E6/uL (ref 4.14–5.80)
RDW: 13.1 % (ref 12.3–15.4)
WBC: 6.3 10*3/uL (ref 3.4–10.8)

## 2018-12-08 NOTE — Telephone Encounter (Signed)
PA submitted for Myrbetriq 25 mg TB 24 tablets via cover my meds.  Case ID [id number]. Will follow on cover my meds.

## 2018-12-14 ENCOUNTER — Telehealth: Payer: Self-pay

## 2018-12-14 MED ORDER — OXYBUTYNIN CHLORIDE ER 5 MG PO TB24: 5.0000 mg | ORAL_TABLET | Freq: Every day | ORAL | 6 refills | Status: DC

## 2018-12-14 NOTE — Telephone Encounter (Signed)
JCV result from 12/07/2018  JCV antibody- Indeterminate  Titer- 0.35 Inhibition Assay Final Result- Positive  Results given to Dr. Jannifer Franklin to review.

## 2018-12-14 NOTE — Telephone Encounter (Signed)
Levada Dy from [employer] called to advise the Myrbetriq 25 mg PA has been denied.  Alternative would Oxybutynin. Will fwd to on call provider to see if this would be an appropriate medication.  MB, RN

## 2018-12-14 NOTE — Telephone Encounter (Signed)
I contacted [facility] [employer] to verify if Oxybutin would be covered by the pharmacy and was advised the prescription would be covered. Ashea stated she would contact the patient and advise medication is ready for pick up. MB RN

## 2018-12-14 NOTE — Telephone Encounter (Signed)
Oxybutynin 46m 24 hr tablet every night was called in to his pharmacy

## 2018-12-14 NOTE — Addendum Note (Signed)
Addended by: Marcial Pacas on: 12/14/2018 05:02 PM   Modules accepted: Orders

## 2018-12-21 ENCOUNTER — Telehealth: Payer: Self-pay | Admitting: Neurology

## 2018-12-21 NOTE — Telephone Encounter (Signed)
JC viral antibody panel remains positive but in low titer, index value of 0.35, plans to switch off of Tysabri in the summer 2020.

## 2018-12-24 ENCOUNTER — Telehealth: Payer: Self-pay | Admitting: Neurology

## 2018-12-24 NOTE — Telephone Encounter (Signed)
Patient want's to talk to [name] about his scheduled in fusions .

## 2018-12-29 ENCOUNTER — Telehealth: Payer: Self-pay | Admitting: Neurology

## 2018-12-29 NOTE — Telephone Encounter (Signed)
Transferred call to [employer].

## 2019-01-18 ENCOUNTER — Telehealth: Payer: Self-pay | Admitting: Neurology

## 2019-01-18 NOTE — Telephone Encounter (Signed)
pt has called for the intrafusion suite, call transferred

## 2019-02-09 ENCOUNTER — Telehealth: Payer: Self-pay

## 2019-02-09 NOTE — Telephone Encounter (Signed)
Tysabri reauthorization fax to the [place] touch program. Fax # [phone]. Confirmation received.

## 2019-02-09 NOTE — Telephone Encounter (Signed)
Received fax from [facility] stating pt has been authorized for Tysabri and auth is valid from 02/09/29-09/09/2019  Pt enrollment # [id number]  Infusion authorization # [id number] Infusion center # [phone] Infusion center f # [id number].

## 2019-03-05 ENCOUNTER — Other Ambulatory Visit: Payer: Self-pay | Admitting: Neurology

## 2019-03-08 ENCOUNTER — Other Ambulatory Visit: Payer: Self-pay

## 2019-03-08 MED ORDER — OXYBUTYNIN CHLORIDE ER 5 MG PO TB24: 5.0000 mg | ORAL_TABLET | Freq: Every day | ORAL | 2 refills | Status: DC

## 2019-05-24 ENCOUNTER — Other Ambulatory Visit: Payer: Self-pay | Admitting: Neurology

## 2019-07-01 ENCOUNTER — Ambulatory Visit (INDEPENDENT_AMBULATORY_CARE_PROVIDER_SITE_OTHER): Payer: Managed Care, Other (non HMO) | Admitting: Neurology

## 2019-07-01 ENCOUNTER — Encounter: Payer: Self-pay | Admitting: Neurology

## 2019-07-01 ENCOUNTER — Other Ambulatory Visit: Payer: Self-pay

## 2019-07-01 VITALS — BP 132/80 | HR 85 | Temp 97.4°F | Ht 73.0 in | Wt 301.1 lb

## 2019-07-01 DIAGNOSIS — Z5181 Encounter for therapeutic drug level monitoring: Secondary | ICD-10-CM | POA: Diagnosis not present

## 2019-07-01 DIAGNOSIS — G35 Multiple sclerosis: Secondary | ICD-10-CM | POA: Diagnosis not present

## 2019-07-01 NOTE — Progress Notes (Signed)
Reason for visit: Multiple sclerosis  Daniel Chavez is an 43 y.o. male  History of present illness:  Daniel Chavez is a 43 year old left-handed white male with a history of multiple sclerosis.  The patient has been on Tysabri, he has tolerated the medication quite well.  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  The patient last had MRI of the brain and cervical spine done about a year ago.  He reports no new symptoms of his multiple sclerosis.  He does have some residual slight numbness and clumsiness of the right hand, he reports no balance issues or difficulty controlling the bowels or the bladder.  He has not had any falls, he does report some visual blurring but he was told by his eye doctor that he had presbyopia and will require reading glasses.  He returns for an evaluation.  He had his last Tysabri dosing yesterday.  Past Medical History:  Diagnosis Date  . History of hiatal hernia   . MS (multiple sclerosis) ([place])   . Multiple sclerosis ([place]) 09/22/2013  . Obesity   . Optic neuritis, left     Past Surgical History:  Procedure Laterality Date  . HERNIA REPAIR     2005/2007 Left siide hiatal hernia  . INCISIONAL HERNIA REPAIR N/A 10/20/2017   Procedure: REPAIR OF RECURRENT INCISIONAL HERNIA WITH MESH;  Surgeon: Georganna Skeans, MD;  Location: [facility];  Service: General;  Laterality: N/A;  . INSERTION OF MESH N/A 10/20/2017   Procedure: INSERTION OF MESH;  Surgeon: Georganna Skeans, MD;  Location: [facility] OR;  Service: General;  Laterality: N/A;    Family History  Problem Relation Age of Onset  . Leukemia Sister     Social history:  reports that he quit smoking about 10 years ago. He has never used smokeless tobacco. He reports that he does not drink alcohol or use drugs.   No Known Allergies  Medications:  Prior to Admission medications   Medication Sig Start Date End  Date Taking? Authorizing Provider  amLODipine (NORVASC) 5 MG tablet  02/12/19  Yes [provider]  ibuprofen (ADVIL,MOTRIN) 200 MG tablet Take 400-600 mg by mouth every 8 (eight) hours as needed for headache or mild pain (depends on pain if takes 2-3 tablets).   Yes [provider]  meclizine (ANTIVERT) 25 MG tablet Take 1 tablet (25 mg total) by mouth 4 (four) times daily as needed for dizziness. 09/04/18  Yes Kathrynn Ducking, MD  mirabegron ER (MYRBETRIQ) 25 MG TB24 tablet Take 1 tablet (25 mg total) by mouth daily. 12/07/18  Yes Kathrynn Ducking, MD  modafinil (PROVIGIL) 200 MG tablet TAKE ONE TABLET BY MOUTH DAILY 05/24/19  Yes Kathrynn Ducking, MD  Multiple Vitamin (MULTIVITAMIN WITH MINERALS) TABS tablet Take 1 tablet by mouth 2 (two) times a week.   Yes [provider]  natalizumab (TYSABRI) 300 MG/15ML injection Inject 300 mg into the vein every 28 (twenty-eight) days. Last injection was 09-29-17   Yes [provider]  oxybutynin (DITROPAN-XL) 10 MG 24 hr tablet Take 10 mg by mouth at bedtime.   Yes [provider]  zolpidem (AMBIEN) 10 MG tablet TAKE ONE TABLET BY MOUTH AT BEDTIME AS NEEDED FOR SLEEP 03/08/19  Yes Kathrynn Ducking, MD    ROS:  Out of a complete 14 system review of symptoms, the  patient complains only of the following symptoms, and all other reviewed systems are negative.  Fatigue Right hand clumsiness  Blood pressure 132/80, pulse 85, temperature (!) 97.4 F (36.3 C), temperature source Temporal, height 6' 1"  (1.854 m), weight (!) 301 lb 2 oz (136.6 kg), SpO2 96 %.  Physical Exam  General: The patient is alert and cooperative at the time of the examination.  The patient is markedly obese.  Skin: No significant peripheral edema is noted.   Neurologic Exam  Mental status: The patient is alert and oriented x 3 at the time of the examination. The patient has apparent normal recent and remote memory, with an apparently  normal attention span and concentration ability.   Cranial nerves: Facial symmetry is present. Speech is normal, no aphasia or dysarthria is noted. Extraocular movements are full. Visual fields are full.  Pupils are equal, round, and reactive to light.  Discs are flat bilaterally.  Motor: The patient has good strength in all 4 extremities.  Sensory examination: Soft touch sensation is symmetric on the face, arms, and legs.  Coordination: The patient has good finger-nose-finger and heel-to-shin bilaterally.  Gait and station: The patient has a normal gait. Tandem gait is normal. Romberg is negative. No drift is seen.  Reflexes: Deep tendon reflexes are symmetric.   Assessment/Plan:  1.  Multiple sclerosis  The patient will be taken off of Tysabri at this point with plans to switch over to Ocrevus in the next 6 to 8 weeks.  The patient will have blood work done today.  MRI of the brain and cervical spine will be done.  He will follow-up here in 6 months.  He has signed a form for the Ocrevus.  Jill Alexanders MD 07/01/2019 3:15 PM  [facility] [address] [place], [place] [address]  Phone [phone] Fax [phone]

## 2019-07-04 LAB — CBC WITH DIFFERENTIAL/PLATELET
Basophils Absolute: 0 10*3/uL (ref 0.0–0.2)
Basos: 1 %
EOS (ABSOLUTE): 0.1 10*3/uL (ref 0.0–0.4)
Eos: 1 %
Hematocrit: 42.7 % (ref 37.5–51.0)
Hemoglobin: 15 g/dL (ref 13.0–17.7)
Immature Grans (Abs): 0.1 10*3/uL (ref 0.0–0.1)
Immature Granulocytes: 1 %
Lymphocytes Absolute: 2.3 10*3/uL (ref 0.7–3.1)
Lymphs: 30 %
MCH: 30.5 pg (ref 26.6–33.0)
MCHC: 35.1 g/dL (ref 31.5–35.7)
MCV: 87 fL (ref 79–97)
Monocytes Absolute: 0.7 10*3/uL (ref 0.1–0.9)
Monocytes: 8 %
Neutrophils Absolute: 4.5 10*3/uL (ref 1.4–7.0)
Neutrophils: 59 %
Platelets: 221 10*3/uL (ref 150–450)
RBC: 4.92 x10E6/uL (ref 4.14–5.80)
RDW: 13.5 % (ref 11.6–15.4)
WBC: 7.7 10*3/uL (ref 3.4–10.8)

## 2019-07-04 LAB — COMPREHENSIVE METABOLIC PANEL
ALT: 34 IU/L (ref 0–44)
AST: 20 IU/L (ref 0–40)
Albumin/Globulin Ratio: 2.1 (ref 1.2–2.2)
Albumin: 4.7 g/dL (ref 4.0–5.0)
Alkaline Phosphatase: 68 IU/L (ref 39–117)
BUN/Creatinine Ratio: 14 (ref 9–20)
BUN: 15 mg/dL (ref 6–24)
Bilirubin Total: 0.5 mg/dL (ref 0.0–1.2)
CO2: 24 mmol/L (ref 20–29)
Calcium: 9.6 mg/dL (ref 8.7–10.2)
Chloride: 102 mmol/L (ref 96–106)
Creatinine, Ser: 1.05 mg/dL (ref 0.76–1.27)
GFR calc Af Amer: 99 mL/min/{1.73_m2} (ref >59)
GFR calc non Af Amer: 85 mL/min/{1.73_m2} (ref >59)
Globulin, Total: 2.2 g/dL (ref 1.5–4.5)
Glucose: 78 mg/dL (ref 65–99)
Potassium: 4 mmol/L (ref 3.5–5.2)
Sodium: 142 mmol/L (ref 134–144)
Total Protein: 6.9 g/dL (ref 6.0–8.5)

## 2019-07-04 LAB — QUANTIFERON-TB GOLD PLUS
QuantiFERON Mitogen Value: >10 IU/mL
QuantiFERON Nil Value: 0.02 IU/mL
QuantiFERON TB1 Ag Value: 0.04 IU/mL
QuantiFERON TB2 Ag Value: 0.05 IU/mL
QuantiFERON-TB Gold Plus: NEGATIVE

## 2019-07-04 LAB — VARICELLA ZOSTER ANTIBODY, IGG: Varicella zoster IgG: 2327 index (ref >165)

## 2019-07-04 LAB — HEPATITIS C ANTIBODY: Hep C Virus Ab: <0.1 s/co ratio (ref 0.0–0.9)

## 2019-07-04 LAB — HEPATITIS B CORE ANTIBODY, TOTAL: Hep B Core Total Ab: NEGATIVE

## 2019-07-05 ENCOUNTER — Telehealth: Payer: Self-pay | Admitting: Neurology

## 2019-07-05 ENCOUNTER — Telehealth: Payer: Self-pay

## 2019-07-05 NOTE — Telephone Encounter (Signed)
[employer] order sent to [facility]. They obtain the auth and reach out to the patient to schedule.

## 2019-07-05 NOTE — Telephone Encounter (Signed)
Signed start form for Dr. Jannifer Franklin and the pt has been faxed to [facility] # [phone], confirmation received.

## 2019-07-29 NOTE — Telephone Encounter (Signed)
[name]: [id number] (exp. 07/28/19 to 01/24/20) patient is scheduled at [facility] for 07/30/19.

## 2019-07-30 ENCOUNTER — Ambulatory Visit
Admission: RE | Admit: 2019-07-30 | Discharge: 2019-07-30 | Disposition: A | Discharge status: Home / Self Care | Payer: Managed Care, Other (non HMO) | Source: Ambulatory Visit | Attending: Neurology | Admitting: Neurology

## 2019-07-30 ENCOUNTER — Other Ambulatory Visit: Payer: Self-pay

## 2019-07-30 DIAGNOSIS — G35 Multiple sclerosis: Secondary | ICD-10-CM

## 2019-07-30 MED ORDER — GADOBENATE DIMEGLUMINE 529 MG/ML IV SOLN
20.0000 mL | Freq: Once | INTRAVENOUS | Status: AC | PRN
  Administered 2019-07-30: 20 mL via INTRAVENOUS

## 2019-07-31 ENCOUNTER — Telehealth: Payer: Self-pay | Admitting: Neurology

## 2019-07-31 NOTE — Telephone Encounter (Signed)
I called the patient.  The MRI of the brain and cervical spine showed good stability from 1 year ago.  The patient is to convert over to [facility], he has not heard anything yet about the infusion, I will contact the intra-fusion nurse on Monday to see if they had him on schedule.    MRI brain 07/30/19:  IMPRESSION: This MRI of the brain with and without contrast shows the following: 1.   Multiple T2/flair hyperintense foci in the hemispheres, cerebellum, brainstem and thalamus in a pattern and configuration consistent with chronic demyelinating plaque associated with multiple sclerosis.  None of the foci appear to be acute and they do not enhance.  Compared to the MRI dated 06/23/2018, there is no interval change. 2.   Mild chronic sinusitis.   MRI cervical 07/30/19:  IMPRESSION: This MRI of the cervical spine with and without contrast shows the following: 1.  Foci within the spinal cord adjacent to C3, C5, C5-C6, C7 and T1.  None of the foci appears acute or enhances.  They were all present on the 06/23/2018 MRI.  They are consistent with chronic demyelinating plaque associated with multiple sclerosis. 2.  Mild multilevel degenerative changes as detailed above.  There is no spinal stenosis.  There is moderate foraminal narrowing at C2-C3 to the right but no definite nerve root compression.

## 2019-08-09 ENCOUNTER — Telehealth: Payer: Self-pay | Admitting: Neurology

## 2019-08-09 NOTE — Telephone Encounter (Signed)
I reached out to the pt and left a vm ( ok per dpr). I advised he would be scheduled for an infusion date for the ocrevus start once insurance is approved. Pt was advised once we get this information back we will call and schedule this infusion.

## 2019-08-09 NOTE — Telephone Encounter (Signed)
Patient is wanting to know if he is going to have a up coming infusion date Dr. Jannifer Franklin is switching from [place] to [place] ?

## 2019-08-10 ENCOUNTER — Telehealth: Payer: Self-pay

## 2019-08-10 NOTE — Telephone Encounter (Signed)
PA for ocrevus has been submitted (Key: [id number])  Your information has been submitted and will be reviewed by [place]. You may close this dialog, return to your dashboard, and perform other tasks. An electronic determination will be received in CoverMyMeds within 72-120 hours. You can see the latest determination by locating this request on your dashboard or by reopening this request. You will receive a fax copy of the determination. If [name] has not responded in 120 hours, contact [employer] at [phone].

## 2019-08-26 ENCOUNTER — Telehealth: Payer: Self-pay | Admitting: Neurology

## 2019-08-26 ENCOUNTER — Encounter: Payer: Self-pay | Admitting: Neurology

## 2019-08-26 NOTE — Telephone Encounter (Signed)
Pt called wanting to know if it's possible for him to get a letter stating that since he is on Ocrevus and that lowers the immune system, if there is any way it can be limited him going to his work office due to several Covid cases that have happened in the work place. Please advise.

## 2019-08-26 NOTE — Telephone Encounter (Signed)
I called the patient.  The patient works as an Agricultural consultant, he just wants to have a letter indicating that he has been on medication that lowers his immune system and that he not be put into situation where he has to be in an enclosed environment with a lot of other people around.  I will dictate this note.

## 2019-08-31 ENCOUNTER — Telehealth: Payer: Self-pay | Admitting: Neurology

## 2019-08-31 NOTE — Telephone Encounter (Signed)
I reached out to the pt. He reports since his last infusion he has been having back pain and increased tiredness. Pt has spoke with intrafusion nurse Jeb Levering) and was advised these were normal side effects. Pt was advised if symptoms were to worsen or if new symtoms appeared to reach out. He verbalized understanding.

## 2019-08-31 NOTE — Telephone Encounter (Signed)
Pt is asking for a call from RN to discuss how he has been feeling since his infusion.  Pt unsure if how he feels is expected after an infusion.  Please call

## 2019-09-05 ENCOUNTER — Other Ambulatory Visit: Payer: Self-pay

## 2019-09-05 ENCOUNTER — Emergency Department (HOSPITAL_COMMUNITY): Payer: Managed Care, Other (non HMO)

## 2019-09-05 ENCOUNTER — Emergency Department (HOSPITAL_COMMUNITY)
Admission: EM | Admit: 2019-09-05 | Discharge: 2019-09-06 | Disposition: A | Discharge status: Home / Self Care | Payer: Managed Care, Other (non HMO) | Attending: Emergency Medicine | Admitting: Emergency Medicine

## 2019-09-05 ENCOUNTER — Encounter (HOSPITAL_COMMUNITY): Payer: Self-pay | Admitting: Emergency Medicine

## 2019-09-05 DIAGNOSIS — Z79899 Other long term (current) drug therapy: Secondary | ICD-10-CM | POA: Diagnosis not present

## 2019-09-05 DIAGNOSIS — R0602 Shortness of breath: Secondary | ICD-10-CM

## 2019-09-05 DIAGNOSIS — G35 Multiple sclerosis: Secondary | ICD-10-CM | POA: Insufficient documentation

## 2019-09-05 DIAGNOSIS — U071 COVID-19: Secondary | ICD-10-CM | POA: Insufficient documentation

## 2019-09-05 DIAGNOSIS — R509 Fever, unspecified: Secondary | ICD-10-CM | POA: Diagnosis present

## 2019-09-05 DIAGNOSIS — Z87891 Personal history of nicotine dependence: Secondary | ICD-10-CM | POA: Insufficient documentation

## 2019-09-05 LAB — CBC WITH DIFFERENTIAL/PLATELET
Abs Immature Granulocytes: 0.03 10*3/uL (ref 0.00–0.07)
Basophils Absolute: 0 10*3/uL (ref 0.0–0.1)
Basophils Relative: 0 %
Eosinophils Absolute: 0 10*3/uL (ref 0.0–0.5)
Eosinophils Relative: 0 %
HCT: 45.3 % (ref 39.0–52.0)
Hemoglobin: 15.2 g/dL (ref 13.0–17.0)
Immature Granulocytes: 1 %
Lymphocytes Relative: 17 %
Lymphs Abs: 0.8 10*3/uL (ref 0.7–4.0)
MCH: 30 pg (ref 26.0–34.0)
MCHC: 33.6 g/dL (ref 30.0–36.0)
MCV: 89.5 fL (ref 80.0–100.0)
Monocytes Absolute: 0.4 10*3/uL (ref 0.1–1.0)
Monocytes Relative: 9 %
Neutro Abs: 3.6 10*3/uL (ref 1.7–7.7)
Neutrophils Relative %: 73 %
Platelets: 174 10*3/uL (ref 150–400)
RBC: 5.06 MIL/uL (ref 4.22–5.81)
RDW: 13.3 % (ref 11.5–15.5)
WBC: 4.8 10*3/uL (ref 4.0–10.5)
nRBC: 0 % (ref 0.0–0.2)

## 2019-09-05 LAB — URINALYSIS, ROUTINE W REFLEX MICROSCOPIC
Bilirubin Urine: NEGATIVE
Glucose, UA: NEGATIVE mg/dL
Hgb urine dipstick: NEGATIVE
Ketones, ur: 20 mg/dL — AB
Leukocytes,Ua: NEGATIVE
Nitrite: NEGATIVE
Protein, ur: NEGATIVE mg/dL
Specific Gravity, Urine: 1.031 — ABNORMAL HIGH (ref 1.005–1.030)
pH: 5 (ref 5.0–8.0)

## 2019-09-05 LAB — COMPREHENSIVE METABOLIC PANEL
ALT: 36 U/L (ref 0–44)
AST: 26 U/L (ref 15–41)
Albumin: 3.9 g/dL (ref 3.5–5.0)
Alkaline Phosphatase: 63 U/L (ref 38–126)
Anion gap: 13 (ref 5–15)
BUN: 17 mg/dL (ref 6–20)
CO2: 21 mmol/L — ABNORMAL LOW (ref 22–32)
Calcium: 8.7 mg/dL — ABNORMAL LOW (ref 8.9–10.3)
Chloride: 105 mmol/L (ref 98–111)
Creatinine, Ser: 1.12 mg/dL (ref 0.61–1.24)
GFR calc Af Amer: >60 mL/min (ref >60)
GFR calc non Af Amer: >60 mL/min (ref >60)
Glucose, Bld: 127 mg/dL — ABNORMAL HIGH (ref 70–99)
Potassium: 3.5 mmol/L (ref 3.5–5.1)
Sodium: 139 mmol/L (ref 135–145)
Total Bilirubin: 1 mg/dL (ref 0.3–1.2)
Total Protein: 6.5 g/dL (ref 6.5–8.1)

## 2019-09-05 LAB — LACTIC ACID, PLASMA
Lactic Acid, Venous: 0.9 mmol/L (ref 0.5–1.9)
Lactic Acid, Venous: 1 mmol/L (ref 0.5–1.9)

## 2019-09-05 LAB — PROTIME-INR
INR: 1.1 (ref 0.8–1.2)
Prothrombin Time: 14.2 seconds (ref 11.4–15.2)

## 2019-09-05 LAB — SARS CORONAVIRUS 2 BY RT PCR (HOSPITAL ORDER, PERFORMED IN ~~LOC~~ HOSPITAL LAB): SARS Coronavirus 2: POSITIVE — AB

## 2019-09-05 MED ORDER — ACETAMINOPHEN 325 MG PO TABS
650.0000 mg | ORAL_TABLET | Freq: Once | ORAL | Status: AC
  Administered 2019-09-05: 650 mg via ORAL
  Filled 2019-09-05: qty 2

## 2019-09-05 NOTE — ED Notes (Signed)
Care handoff to [facility]

## 2019-09-05 NOTE — ED Provider Notes (Signed)
[facility] EMERGENCY DEPARTMENT Provider Note   CSN: [id number] Arrival date & time: 09/05/19  1832     History   Chief Complaint Chief Complaint  Patient presents with  . Fever    HPI Daniel Chavez is a 43 y.o. male.     The history is provided by the patient.  Fever Max temp prior to arrival:  102 Temp source:  Oral Severity:  Moderate Onset quality:  Gradual Duration:  3 days Timing:  Intermittent Progression:  Unchanged Chronicity:  New Relieved by:  None tried Worsened by:  Nothing Ineffective treatments:  None tried Associated symptoms: chills   Associated symptoms: no chest pain, no confusion, no congestion, no cough, no diarrhea, no dysuria, no headaches, no myalgias, no nausea, no rash, no rhinorrhea, no sore throat and no vomiting   Risk factors: immunosuppression   Risk factors: no recent sickness and no sick contacts   Risk factors comment:  Hx of MS and recently started ocrevus   Past Medical History:  Diagnosis Date  . History of hiatal hernia   . MS (multiple sclerosis) ([place])   . Multiple sclerosis ([place]) 09/22/2013  . Obesity   . Optic neuritis, left     Patient Active Problem List   Diagnosis Date Noted  . Incarcerated incisional hernia 10/20/2017  . Multiple sclerosis ([place]) 09/22/2013    Past Surgical History:  Procedure Laterality Date  . HERNIA REPAIR     2005/2007 Left siide hiatal hernia  . INCISIONAL HERNIA REPAIR N/A 10/20/2017   Procedure: REPAIR OF RECURRENT INCISIONAL HERNIA WITH MESH;  Surgeon: Georganna Skeans, MD;  Location: [facility];  Service: General;  Laterality: N/A;  . INSERTION OF MESH N/A 10/20/2017   Procedure: INSERTION OF MESH;  Surgeon: Georganna Skeans, MD;  Location: [facility];  Service: General;  Laterality: N/A;        Home Medications    Prior to Admission medications   Medication Sig Start Date End Date Taking? Authorizing Provider  amLODipine (NORVASC) 5 MG tablet  02/12/19   [provider]  ibuprofen (ADVIL,MOTRIN) 200 MG tablet Take 400-600 mg by mouth every 8 (eight) hours as needed for headache or mild pain (depends on pain if takes 2-3 tablets).    [provider]  meclizine (ANTIVERT) 25 MG tablet Take 1 tablet (25 mg total) by mouth 4 (four) times daily as needed for dizziness. 09/04/18   Kathrynn Ducking, MD  mirabegron ER (MYRBETRIQ) 25 MG TB24 tablet Take 1 tablet (25 mg total) by mouth daily. 12/07/18   Kathrynn Ducking, MD  modafinil (PROVIGIL) 200 MG tablet TAKE ONE TABLET BY MOUTH DAILY 05/24/19   Kathrynn Ducking, MD  Multiple Vitamin (MULTIVITAMIN WITH MINERALS) TABS tablet Take 1 tablet by mouth 2 (two) times a week.    [provider]  natalizumab (TYSABRI) 300 MG/15ML injection Inject 300 mg into the vein every 28 (twenty-eight) days. Last injection was 09-29-17    [provider]  oxybutynin (DITROPAN-XL) 10 MG 24 hr tablet Take 10 mg by mouth at bedtime.    [provider]  zolpidem (AMBIEN) 10 MG tablet TAKE ONE TABLET BY MOUTH AT BEDTIME AS NEEDED FOR SLEEP 03/08/19   Kathrynn Ducking, MD    Family History Family History  Problem Relation Age of Onset  . Leukemia Sister     Social History Social History   Tobacco Use  . Smoking status: Former Smoker    Quit date:  10/15/2008    Years since quitting: 10.8  . Smokeless tobacco: Never Used  . Tobacco comment: quit 03/2006  Substance Use Topics  . Alcohol use: No  . Drug use: No     Allergies   Patient has no known allergies.   Review of Systems Review of Systems  Constitutional: Positive for chills and fever.  HENT: Negative for congestion, rhinorrhea and sore throat.   Respiratory: Negative for cough.   Cardiovascular: Negative for chest pain.  Gastrointestinal: Negative for diarrhea, nausea and vomiting.  Genitourinary: Negative for dysuria.  Musculoskeletal: Negative for myalgias.  Skin: Negative for rash.  Neurological: Negative for  headaches.  Psychiatric/Behavioral: Negative for confusion.  All other systems reviewed and are negative.    Physical Exam Updated Vital Signs BP (!) 141/98 (BP Location: Right Arm)   Pulse (!) 108   Temp (!) 101.3 F (38.5 C) (Oral)   Resp 20   SpO2 94%   Physical Exam Vitals signs and nursing note reviewed.  Constitutional:      General: He is not in acute distress.    Appearance: He is well-developed. He is obese.  HENT:     Head: Normocephalic and atraumatic.     Right Ear: Tympanic membrane normal.     Left Ear: Tympanic membrane normal.     Nose: Nose normal.     Mouth/Throat:     Mouth: Mucous membranes are moist.  Eyes:     Conjunctiva/sclera: Conjunctivae normal.     Pupils: Pupils are equal, round, and reactive to light.  Neck:     Musculoskeletal: Normal range of motion and neck supple. No spinous process tenderness or muscular tenderness.     Meningeal: Brudzinski's sign and Kernig's sign absent.  Cardiovascular:     Rate and Rhythm: Regular rhythm. Tachycardia present.     Pulses: Normal pulses.     Heart sounds: No murmur.  Pulmonary:     Effort: Pulmonary effort is normal. Tachypnea present. No respiratory distress.     Breath sounds: Normal breath sounds. No wheezing or rales.  Abdominal:     General: There is no distension.     Palpations: Abdomen is soft.     Tenderness: There is no abdominal tenderness. There is no guarding or rebound.  Musculoskeletal: Normal range of motion.        General: No tenderness.  Skin:    General: Skin is warm and dry.     Findings: No erythema or rash.  Neurological:     General: No focal deficit present.     Mental Status: He is alert and oriented to person, place, and time. Mental status is at baseline.  Psychiatric:        Mood and Affect: Mood normal.        Behavior: Behavior normal.        Thought Content: Thought content normal.      ED Treatments / Results  Labs (all labs ordered are listed, but  only abnormal results are displayed) Labs Reviewed  SARS CORONAVIRUS 2 (HOSPITAL ORDER, [facility]) - Abnormal; Notable for the following components:      Result Value   SARS Coronavirus 2 POSITIVE (*)    All other components within normal limits  COMPREHENSIVE METABOLIC PANEL - Abnormal; Notable for the following components:   CO2 21 (*)    Glucose, Bld 127 (*)    Calcium 8.7 (*)    All other components within normal limits  URINALYSIS, ROUTINE W REFLEX MICROSCOPIC - Abnormal; Notable for the following components:   Color, Urine AMBER (*)    Specific Gravity, Urine 1.031 (*)    Ketones, ur 20 (*)    All other components within normal limits  CULTURE, BLOOD (ROUTINE X 2)  CULTURE, BLOOD (ROUTINE X 2)  LACTIC ACID, PLASMA  LACTIC ACID, PLASMA  CBC WITH DIFFERENTIAL/PLATELET  PROTIME-INR    EKG None  Radiology Dg Chest Port 1 View  Result Date: 09/05/2019 CLINICAL DATA:  Fevers for several days EXAM: PORTABLE CHEST 1 VIEW COMPARISON:  None. FINDINGS: Cardiac shadow is mildly accentuated by the portable technique. The lungs are clear bilaterally. No focal infiltrate or effusion is noted. No bony abnormality is seen. IMPRESSION: No active disease. Electronically Signed   By: Inez Catalina M.D.   On: 09/05/2019 20:32    Procedures Procedures (including critical care time)  Medications Ordered in ED Medications  acetaminophen (TYLENOL) tablet 650 mg (has no administration in time range)     Initial Impression / Assessment and Plan / ED Course  I have reviewed the triage vital signs and the nursing notes.  Pertinent labs & imaging results that were available during my care of the patient were reviewed by me and considered in my medical decision making (see chart for details).        Patient is a 43 year old male presenting today with fever of unknown origin.  Patient is immunosuppressed as he is started Ocrevus for MS at the end of August.   Patient states he developed a fever on Friday and then was okay yesterday and had another fever today.  He has no other symptoms at this time but is supposed to get another infusion tomorrow and his neurologist wanted to come here for evaluation.  Patient is febrile to 101.3 and tachycardic with some mild tachypnea but clear breath sounds.  Sepsis work-up initiated with blood cultures, lactate, labs and chest x-ray.  COVID testing also placed.  Patient is well-appearing on exam.  10:45 PM Labs and imaging are within normal limits but patient is COVID positive.  This is most likely the cause of his fever.  Patient otherwise is well-appearing and was sent home to quarantine.  He will contact his neurologist in the morning as he will need to hold infusion.  Final Clinical Impressions(s) / ED Diagnoses   Final diagnoses:  COVID-19 virus detected    ED Discharge Orders    None       Blanchie Dessert, MD 09/05/19 2245

## 2019-09-05 NOTE — ED Triage Notes (Addendum)
Pt reports fever since Friday up to 101.5 at home.  Received Ocrevus infusion for MS at [facility] on 8/31 and believes fever is related.  Denies any other symptoms.  Took Tylenol 650 mg at 1630.

## 2019-09-05 NOTE — ED Notes (Signed)
Pts wifes number under pt contact, would like to be notified if able to come back to room.

## 2019-09-05 NOTE — Discharge Instructions (Signed)
Make sure you are quarantining at home and avoiding being around others.  If you start developing symptoms of shortness of breath like you cannot get enough air in, vomiting and you cannot hold anything down please return for recheck.

## 2019-09-10 ENCOUNTER — Emergency Department (HOSPITAL_COMMUNITY)
Admission: EM | Admit: 2019-09-10 | Discharge: 2019-09-10 | Disposition: A | Discharge status: Home / Self Care | Payer: Managed Care, Other (non HMO) | Attending: Emergency Medicine | Admitting: Emergency Medicine

## 2019-09-10 ENCOUNTER — Encounter (HOSPITAL_COMMUNITY): Payer: Self-pay | Admitting: Emergency Medicine

## 2019-09-10 ENCOUNTER — Telehealth: Payer: Self-pay | Admitting: *Deleted

## 2019-09-10 ENCOUNTER — Other Ambulatory Visit: Payer: Self-pay

## 2019-09-10 DIAGNOSIS — U071 COVID-19: Secondary | ICD-10-CM

## 2019-09-10 DIAGNOSIS — Z9981 Dependence on supplemental oxygen: Secondary | ICD-10-CM | POA: Insufficient documentation

## 2019-09-10 DIAGNOSIS — G35 Multiple sclerosis: Secondary | ICD-10-CM | POA: Insufficient documentation

## 2019-09-10 DIAGNOSIS — R509 Fever, unspecified: Secondary | ICD-10-CM | POA: Diagnosis present

## 2019-09-10 DIAGNOSIS — Z87891 Personal history of nicotine dependence: Secondary | ICD-10-CM | POA: Insufficient documentation

## 2019-09-10 DIAGNOSIS — Z79899 Other long term (current) drug therapy: Secondary | ICD-10-CM | POA: Diagnosis not present

## 2019-09-10 DIAGNOSIS — Z20828 Contact with and (suspected) exposure to other viral communicable diseases: Secondary | ICD-10-CM | POA: Insufficient documentation

## 2019-09-10 LAB — CULTURE, BLOOD (ROUTINE X 2)
Culture: NO GROWTH
Culture: NO GROWTH
Special Requests: ADEQUATE
Special Requests: ADEQUATE

## 2019-09-10 NOTE — ED Provider Notes (Signed)
[facility] DEPT Provider Note   CSN: [id number] Arrival date & time: 09/10/19  1114     History   Chief Complaint Chief Complaint  Patient presents with  . Low oxygen saturation    HPI Daniel Chavez is a 43 y.o. male.  He has a history of MS and is on immune therapy.  He was here 5 days ago for evaluation of a fever.  Ultimately he was diagnosed with Covid positive.  He has been at home and continues to have fevers.  Last one was this morning at 8:30 AM he was 102.  He took ibuprofen for that.  He has been checking his oxygen saturation with a portable device and found that his sats were 89%.  He denies any headache sore throat cough shortness of breath chest pain abdominal pain nausea vomiting diarrhea rashes loss of taste or smell.     The history is provided by the patient.  Fever Max temp prior to arrival:  102 Temp source:  Oral Severity:  Moderate Onset quality:  Gradual Duration:  5 days Timing:  Intermittent Progression:  Unchanged Chronicity:  New Relieved by:  Acetaminophen and ibuprofen Worsened by:  Nothing Ineffective treatments:  None tried Associated symptoms: no chest pain, no chills, no congestion, no cough, no diarrhea, no dysuria, no headaches, no myalgias, no nausea, no rash, no sore throat and no vomiting   Risk factors: immunosuppression     Past Medical History:  Diagnosis Date  . History of hiatal hernia   . MS (multiple sclerosis) ([place])   . Multiple sclerosis ([place]) 09/22/2013  . Obesity   . Optic neuritis, left     Patient Active Problem List   Diagnosis Date Noted  . Incarcerated incisional hernia 10/20/2017  . Multiple sclerosis ([place]) 09/22/2013    Past Surgical History:  Procedure Laterality Date  . HERNIA REPAIR     2005/2007 Left siide hiatal hernia  . INCISIONAL HERNIA REPAIR N/A 10/20/2017   Procedure: REPAIR OF RECURRENT INCISIONAL HERNIA WITH MESH;  Surgeon: Georganna Skeans, MD;  Location: [facility];  Service: General;  Laterality: N/A;  . INSERTION OF MESH N/A 10/20/2017   Procedure: INSERTION OF MESH;  Surgeon: Georganna Skeans, MD;  Location: [facility];  Service: General;  Laterality: N/A;        Home Medications    Prior to Admission medications   Medication Sig Start Date End Date Taking? Authorizing Provider  acetaminophen (TYLENOL) 325 MG tablet Take 325 mg by mouth every 6 (six) hours as needed for mild pain or headache.   Yes [provider]  amLODipine (NORVASC) 5 MG tablet Take 5 mg by mouth daily.  02/12/19  Yes [provider]  ibuprofen (ADVIL,MOTRIN) 200 MG tablet Take 400-600 mg by mouth every 8 (eight) hours as needed for headache or mild pain (depends on pain if takes 2-3 tablets).   Yes [provider]  meclizine (ANTIVERT) 25 MG tablet Take 1 tablet (25 mg total) by mouth 4 (four) times daily as needed for dizziness. 09/04/18  Yes Kathrynn Ducking, MD  modafinil (PROVIGIL) 200 MG tablet TAKE ONE TABLET BY MOUTH DAILY Patient taking differently: Take 200 mg by mouth daily.  05/24/19  Yes Kathrynn Ducking, MD  ocrelizumab (OCREVUS) 300 MG/10ML injection Inject 600 mg into the vein See admin instructions. Inject every 6 months per patient   Yes [provider]  PRESCRIPTION MEDICATION Take 1 tablet by mouth daily. Buproprion, pt  unsure of dose. Pharmacy has no record   Yes [provider]  zolpidem (AMBIEN) 10 MG tablet TAKE ONE TABLET BY MOUTH AT BEDTIME AS NEEDED FOR SLEEP Patient taking differently: Take 10 mg by mouth at bedtime as needed for sleep.  03/08/19  Yes Kathrynn Ducking, MD  mirabegron ER (MYRBETRIQ) 25 MG TB24 tablet Take 1 tablet (25 mg total) by mouth daily. Patient not taking: Reported on 09/10/2019 12/07/18   Kathrynn Ducking, MD    Family History Family History  Problem Relation Age of Onset  . Leukemia Sister     Social History Social History   Tobacco Use  . Smoking status: Former Smoker    Quit  date: 10/15/2008    Years since quitting: 10.9  . Smokeless tobacco: Never Used  . Tobacco comment: quit 03/2006  Substance Use Topics  . Alcohol use: No  . Drug use: No     Allergies   Patient has no known allergies.   Review of Systems Review of Systems  Constitutional: Positive for fever. Negative for chills.  HENT: Negative for congestion and sore throat.   Eyes: Negative for visual disturbance.  Respiratory: Negative for cough and shortness of breath.   Cardiovascular: Negative for chest pain.  Gastrointestinal: Negative for abdominal pain, diarrhea, nausea and vomiting.  Genitourinary: Negative for dysuria.  Musculoskeletal: Negative for myalgias.  Skin: Negative for rash.  Neurological: Negative for headaches.     Physical Exam Updated Vital Signs BP 133/81   Pulse (!) 102   Temp 98.3 F (36.8 C) (Oral)   Resp 18   Ht 6' 1"  (1.854 m)   Wt 131.5 kg   SpO2 97%   BMI 38.26 kg/m   Physical Exam Vitals signs and nursing note reviewed.  Constitutional:      Appearance: He is well-developed.  HENT:     Head: Normocephalic and atraumatic.  Eyes:     Conjunctiva/sclera: Conjunctivae normal.  Neck:     Musculoskeletal: Neck supple.  Cardiovascular:     Rate and Rhythm: Normal rate and regular rhythm.     Pulses: Normal pulses.     Heart sounds: No murmur.  Pulmonary:     Effort: Pulmonary effort is normal. No respiratory distress.     Breath sounds: Normal breath sounds.  Abdominal:     Palpations: Abdomen is soft.     Tenderness: There is no abdominal tenderness.  Musculoskeletal: Normal range of motion.     Right lower leg: No edema.     Left lower leg: No edema.  Skin:    General: Skin is warm and dry.     Capillary Refill: Capillary refill takes less than 2 seconds.  Neurological:     General: No focal deficit present.     Mental Status: He is alert. Mental status is at baseline.      ED Treatments / Results  Labs (all labs ordered are  listed, but only abnormal results are displayed) Labs Reviewed - No data to display  EKG None  Radiology No results found.  Procedures Procedures (including critical care time)  Medications Ordered in ED Medications - No data to display   Initial Impression / Assessment and Plan / ED Course  I have reviewed the triage vital signs and the nursing notes.  Pertinent labs & imaging results that were available during my care of the patient were reviewed by me and considered in my medical decision making (see chart for details).  Clinical  Course as of Sep 09 1910  Fri Sep 10, 2019  1230 Patient known Covid positive here after home pulse ox showed O2 sats to be 89%.  He says he feels the same no better no worse.  Still having fevers but otherwise no symptoms.  Here on the monitor he satting 95% in no distress.  We will have him do some ambulation and see if he desats.   [MB]  1238 The nurse had the patient ambulate in the room and his pulse ox went up to 98%.  I went in to talk to him again and we talked about redoing lab work and chest x-ray versus home with continuing to monitor for symptoms.  He said he would rather go home.  He is not feeling any worse.  He understands to return if any change in his symptoms.   [MB]    Clinical Course User Index [MB] Hayden Rasmussen, MD   Daniel Chavez was evaluated in Emergency Department on 09/10/2019 for the symptoms described in the history of present illness. He was evaluated in the context of the global COVID-19 pandemic, which necessitated consideration that the patient might be at risk for infection with the SARS-CoV-2 virus that causes COVID-19. Institutional protocols and algorithms that pertain to the evaluation of patients at risk for COVID-19 are in a state of rapid change based on information released by regulatory bodies including the CDC and federal and state organizations. These policies and algorithms were followed during the  patient's care in the ED.     Final Clinical Impressions(s) / ED Diagnoses   Final diagnoses:  COVID-19 virus infection    ED Discharge Orders    None       Hayden Rasmussen, MD 09/10/19 1912

## 2019-09-10 NOTE — ED Triage Notes (Signed)
Patient came in due to home O2 saturation ranging from 89- 90%.

## 2019-09-10 NOTE — Telephone Encounter (Signed)
Spoke with patient's wife.  She states he tested positive for COVID 19 on 09/05/2019.  She states they have a home pulse oximeter, and it read that patient's SpO2 was 89-90%.  She states patient is immunocompromised and she plans to take him to the ER.  [facility] and spoke to the charge nurse.  Notified her that patient would be arriving at the ER shortly.

## 2019-09-10 NOTE — ED Notes (Signed)
Patient ambulated around the room. Tolerated ambulation well. O2 sats remained between 95-98%.

## 2019-09-10 NOTE — Discharge Instructions (Addendum)
You were seen in the emergency department for a possible low oxygen level in the setting of a Covid infection.  Your pulse ox here with [place] was 95% and increased to 98% with activity.  You were offered lab work and chest x-ray, but you felt your symptoms were doing well and would rather go home.  Please continue to monitor your symptoms and if any worsening return to the emergency department for further evaluation.  Tylenol for fever.

## 2019-11-29 ENCOUNTER — Other Ambulatory Visit: Payer: Self-pay | Admitting: Neurology

## 2019-12-03 ENCOUNTER — Other Ambulatory Visit: Payer: Self-pay | Admitting: Neurology

## 2019-12-03 MED ORDER — MODAFINIL 200 MG PO TABS: 200.0000 mg | ORAL_TABLET | Freq: Every day | ORAL | 1 refills | Status: DC

## 2019-12-03 NOTE — Telephone Encounter (Signed)
[place] drug registry has been verified. Last refill was 08/23/2019 # 90 for a 90 day supply.

## 2019-12-03 NOTE — Telephone Encounter (Signed)
Pt has called stating that he has been told that [facility] has tried contacting the office several times re: pt's re: of his modafinil (PROVIGIL) 200 MG tablet Pt is in need of his refill to the [facility]

## 2019-12-03 NOTE — Addendum Note (Signed)
Addended by: Verlin Grills T on: 12/03/2019 11:58 AM   Modules accepted: Orders

## 2019-12-06 ENCOUNTER — Telehealth: Payer: Self-pay | Admitting: Neurology

## 2019-12-06 NOTE — Telephone Encounter (Signed)
Pt called wanting to know the update on his prescription refill for his modafinil (PROVIGIL) 200 MG tablet to be sent in to the [facility] on [address]. Pt states he sent in for the refill several days ago. Please advise.

## 2019-12-06 NOTE — Telephone Encounter (Signed)
I reached out to the pt. I received a refill request on 12/03/2019 from the pt stating refill needed to be sent to [facility] on [address]. Dr. Jannifer Franklin sent refill on that day. I contacted the pt and advised of this. Pt verbalized understanding he confirmed rx was to be sent to [facility] on [address] not [facility] on [address].

## 2019-12-08 ENCOUNTER — Telehealth: Payer: Self-pay

## 2019-12-08 NOTE — Telephone Encounter (Signed)
Faxed PA for Modafinil has been sent to [facility]. PA was faxed to # [phone]

## 2019-12-09 NOTE — Telephone Encounter (Signed)
PA for Modafinil 200 mg has been approved through [place]. Pa approval effective from 12/08/2019- 12/07/2020.  PA ref # is [id number].

## 2019-12-24 ENCOUNTER — Other Ambulatory Visit: Payer: Self-pay | Admitting: Neurology

## 2020-01-12 ENCOUNTER — Ambulatory Visit (INDEPENDENT_AMBULATORY_CARE_PROVIDER_SITE_OTHER): Payer: Managed Care, Other (non HMO) | Admitting: Neurology

## 2020-01-12 ENCOUNTER — Encounter: Payer: Self-pay | Admitting: Neurology

## 2020-01-12 ENCOUNTER — Other Ambulatory Visit: Payer: Self-pay

## 2020-01-12 VITALS — BP 121/86 | HR 83 | Temp 97.2°F | Ht 73.0 in | Wt 311.0 lb

## 2020-01-12 DIAGNOSIS — G35 Multiple sclerosis: Secondary | ICD-10-CM

## 2020-01-12 MED ORDER — MECLIZINE HCL 25 MG PO TABS: ORAL_TABLET | ORAL | 6 refills | Status: DC

## 2020-01-12 NOTE — Progress Notes (Addendum)
PATIENT: Daniel Chavez DOB: 10/08/1976  REASON FOR VISIT: follow up HISTORY FROM: patient  HISTORY OF PRESENT ILLNESS: Today 01/12/20  Daniel Chavez is a 44 year old male with history of multiple sclerosis.  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  He received his first Ocrevus infusion in September 2020, during that time, he developed Covid.  He received the second dose a few weeks after.  He has done well on Ocrevus thus far.  He has not noticed the highs and lows like he does with Tysabri.  He had MRI of the brain and cervical spine in August 2020 showing good stability from 1 year ago.  He says he has daily vertigo, takes meclizine for this.  He does report urinary urgency, which he takes oxybutynin for.  He remains on Provigil.  He denies any numbness or weakness in his arms or legs, or changes to his vision.  He has not had any MS related falls.  He continues to work full-time at a [employer].  He is married, has 3 daughters.  He has Ambien that he may take as needed.  He presents today for evaluation unaccompanied.  HISTORY 07/01/2019 Dr. Jannifer Franklin: Daniel Chavez is a 44 year old left-handed white male with a history of multiple sclerosis.  The patient has been on Tysabri, he has tolerated the medication quite well.  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  The patient last had MRI of the brain and cervical spine done about a year ago.  He reports no new symptoms of his multiple sclerosis.  He does have some residual slight numbness and clumsiness of the right hand, he reports no balance issues or difficulty controlling the bowels or the bladder.  He has not had any falls, he does report some visual blurring but he was told by his eye doctor that he had presbyopia and will require reading glasses.  He returns for an evaluation.   He had his last Tysabri dosing yesterday.  REVIEW OF SYSTEMS: Out of a complete 14 system review of symptoms, the patient complains only of the following symptoms, and all other reviewed systems are negative.  Fatigue, dizziness  ALLERGIES: No Known Allergies  HOME MEDICATIONS: Outpatient Medications Prior to Visit  Medication Sig Dispense Refill  . acetaminophen (TYLENOL) 325 MG tablet Take 325 mg by mouth every 6 (six) hours as needed for mild pain or headache.    [name] amLODipine (NORVASC) 10 MG tablet Take 10 mg by mouth daily.    [name] ibuprofen (ADVIL,MOTRIN) 200 MG tablet Take 400-600 mg by mouth every 8 (eight) hours as needed for headache or mild pain (depends on pain if takes 2-3 tablets).    . meclizine (ANTIVERT) 25 MG tablet TAKE ONE TABLET BY MOUTH FOUR TIMES A DAY AS NEEDED FOR DIZZINESS 120 tablet 0  . mirabegron ER (MYRBETRIQ) 25 MG TB24 tablet Take 1 tablet (25 mg total) by mouth daily. 30 tablet 3  . modafinil (PROVIGIL) 200 MG tablet Take 1 tablet (200 mg total) by mouth daily. 90 tablet 1  . ocrelizumab (OCREVUS) 300 MG/10ML injection Inject 600 mg into the vein See admin instructions. Inject every 6 months per patient    . oxybutynin (DITROPAN-XL) 5 MG 24 hr tablet Take 5 mg by mouth daily.    [name]  PRESCRIPTION MEDICATION Take 1 tablet by mouth daily. Buproprion, pt unsure of dose. Pharmacy has no record    . zolpidem (AMBIEN) 10 MG tablet TAKE ONE TABLET BY MOUTH AT BEDTIME AS NEEDED FOR SLEEP (Patient taking differently: Take 10 mg by mouth at bedtime as needed for sleep. ) 30 tablet 3  . amLODipine (NORVASC) 5 MG tablet Take 5 mg by mouth daily.      No facility-administered medications prior to visit.    PAST MEDICAL HISTORY: Past Medical History:  Diagnosis Date  . History of hiatal hernia   . MS (multiple sclerosis) ([place])   . Multiple sclerosis ([place]) 09/22/2013  . Obesity   . Optic neuritis, left     PAST SURGICAL HISTORY: Past Surgical History:  Procedure  Laterality Date  . HERNIA REPAIR     2005/2007 Left siide hiatal hernia  . INCISIONAL HERNIA REPAIR N/A 10/20/2017   Procedure: REPAIR OF RECURRENT INCISIONAL HERNIA WITH MESH;  Surgeon: Georganna Skeans, MD;  Location: [facility];  Service: General;  Laterality: N/A;  . INSERTION OF MESH N/A 10/20/2017   Procedure: INSERTION OF MESH;  Surgeon: Georganna Skeans, MD;  Location: [facility];  Service: General;  Laterality: N/A;    FAMILY HISTORY: Family History  Problem Relation Age of Onset  . Leukemia Sister     SOCIAL HISTORY: Social History   Socioeconomic History  . Marital status: Married    Spouse name: Not on file  . Number of children: 3  . Years of education: hs  . Highest education level: Not on file  Occupational History  . Not on file  Tobacco Use  . Smoking status: Former Smoker    Quit date: 10/15/2008    Years since quitting: 11.2  . Smokeless tobacco: Never Used  . Tobacco comment: quit 03/2006  Substance and Sexual Activity  . Alcohol use: No  . Drug use: No  . Sexual activity: Not on file  Other Topics Concern  . Not on file  Social History Narrative   Lives with wife   Caffeine use: Coffee (1-2 cups per day)   Left handed   Social Determinants of Health   Financial Resource Strain:   . Difficulty of Paying Living Expenses: Not on file  Food Insecurity:   . Worried About Charity fundraiser in the Last Year: Not on file  . Ran Out of Food in the Last Year: Not on file  Transportation Needs:   . Lack of Transportation (Medical): Not on file  . Lack of Transportation (Non-Medical): Not on file  Physical Activity:   . Days of Exercise per Week: Not on file  . Minutes of Exercise per Session: Not on file  Stress:   . Feeling of Stress : Not on file  Social Connections:   . Frequency of Communication with Friends and Family: Not on file  . Frequency of Social Gatherings with Friends and Family: Not on file  . Attends Religious Services: Not on file  .  Active Member of Clubs or Organizations: Not on file  . Attends Archivist Meetings: Not on file  . Marital Status: Not on file  Intimate Partner Violence:   . Fear of Current or Ex-Partner: Not on file  . Emotionally Abused: Not on file  . Physically Abused: Not on file  . Sexually Abused: Not on file   PHYSICAL EXAM  Vitals:   01/12/20 1312  BP: 121/86  Pulse: 83  Temp: (!) 97.2 F (  36.2 C)  Weight: (!) 311 lb (141.1 kg)  Height: 6' 1"  (1.854 m)   Body mass index is 41.03 kg/m.  Generalized: Well developed, in no acute distress   Neurological examination  Mentation: Alert oriented to time, place, history taking. Follows all commands speech and language fluent Cranial nerve II-XII: Pupils were equal round reactive to light. Extraocular movements were full, visual field were full on confrontational test. Facial sensation and strength were normal.  Head turning and shoulder shrug  were normal and symmetric. Motor: The motor testing reveals 5 over 5 strength of all 4 extremities. Good symmetric motor tone is noted throughout.  Sensory: Sensory testing is intact to soft touch on all 4 extremities. No evidence of extinction is noted.  Coordination: Cerebellar testing reveals good finger-nose-finger and heel-to-shin bilaterally.  Gait and station: Gait is normal. Tandem gait is normal.   Reflexes: Deep tendon reflexes are symmetric and normal bilaterally.   DIAGNOSTIC DATA (LABS, IMAGING, TESTING) - I reviewed patient records, labs, notes, testing and imaging myself where available.  Lab Results  Component Value Date   WBC 4.8 09/05/2019   HGB 15.2 09/05/2019   HCT 45.3 09/05/2019   MCV 89.5 09/05/2019   PLT 174 09/05/2019      Component Value Date/Time   NA 139 09/05/2019 1857   NA 142 07/01/2019 1610   K 3.5 09/05/2019 1857   CL 105 09/05/2019 1857   CO2 21 (L) 09/05/2019 1857   GLUCOSE 127 (H) 09/05/2019 1857   BUN 17 09/05/2019 1857   BUN 15 07/01/2019  1610   CREATININE 1.12 09/05/2019 1857   CALCIUM 8.7 (L) 09/05/2019 1857   PROT 6.5 09/05/2019 1857   PROT 6.9 07/01/2019 1610   ALBUMIN 3.9 09/05/2019 1857   ALBUMIN 4.7 07/01/2019 1610   AST 26 09/05/2019 1857   ALT 36 09/05/2019 1857   ALKPHOS 63 09/05/2019 1857   BILITOT 1.0 09/05/2019 1857   BILITOT 0.5 07/01/2019 1610   GFRNONAA >60 09/05/2019 1857   GFRAA >60 09/05/2019 1857   No results found for: CHOL, HDL, LDLCALC, LDLDIRECT, TRIG, CHOLHDL No results found for: HGBA1C Lab Results  Component Value Date   VITAMINB12 959 07/30/2017   No results found for: TSH    ASSESSMENT AND PLAN 44 y.o. year old male  has a past medical history of History of hiatal hernia, MS (multiple sclerosis) ([place]), Multiple sclerosis ([place]) (09/22/2013), Obesity, and Optic neuritis, left. here with:  1.  Multiple sclerosis, relapsing remitting (addended for Intrafusion) 2.  Fatigue   He received his first dose of Ocrevus September 2020, had a  setback, developing Covid shortly thereafter, he received his second 1st dose treatment in October.  He has done well in the interim, has not experienced highs and lows associated with Tysabri. He is not having any new or worsening symptoms.  He will remain on Ocrevus.  His next infusion is scheduled for March 2021.  He had MRI of the brain and cervical spine in August 2020 that showed good stability.  I will check CBC with differential, CMP, CD 19/20 today.  He will remain on meclizine, Provigil, oxybutynin, and Ambien (as needed).  He will follow-up here in 6 months or sooner if needed.  I did advise if his symptoms worsen or if he develops any new symptoms he should let us know. He should be able to receive the COVID vaccine.    I spent 15 minutes with the patient. 50% of this time was  spent discussing his plan of care.   Butler Denmark, AGNP-C, DNP 01/12/2020, 1:16 PM [facility] [address] [place],  [address] [phone]

## 2020-01-12 NOTE — Progress Notes (Signed)
I have read the note, and I agree with the clinical assessment and plan.  Semiah Konczal K Krislynn Gronau   

## 2020-01-12 NOTE — Patient Instructions (Signed)
I will check routine lab work today, continue current medications.   Return in 6 months

## 2020-01-13 LAB — COMPREHENSIVE METABOLIC PANEL
ALT: 34 IU/L (ref 0–44)
AST: 25 IU/L (ref 0–40)
Albumin/Globulin Ratio: 1.8 (ref 1.2–2.2)
Albumin: 4.6 g/dL (ref 4.0–5.0)
Alkaline Phosphatase: 73 IU/L (ref 39–117)
BUN/Creatinine Ratio: 15 (ref 9–20)
BUN: 14 mg/dL (ref 6–24)
Bilirubin Total: 0.3 mg/dL (ref 0.0–1.2)
CO2: 21 mmol/L (ref 20–29)
Calcium: 10 mg/dL (ref 8.7–10.2)
Chloride: 104 mmol/L (ref 96–106)
Creatinine, Ser: 0.92 mg/dL (ref 0.76–1.27)
GFR calc Af Amer: 116 mL/min/{1.73_m2} (ref >59)
GFR calc non Af Amer: 100 mL/min/{1.73_m2} (ref >59)
Globulin, Total: 2.6 g/dL (ref 1.5–4.5)
Glucose: 83 mg/dL (ref 65–99)
Potassium: 4.5 mmol/L (ref 3.5–5.2)
Sodium: 142 mmol/L (ref 134–144)
Total Protein: 7.2 g/dL (ref 6.0–8.5)

## 2020-01-15 LAB — CD19 AND CD20, FLOW CYTOMETRY

## 2020-01-17 ENCOUNTER — Telehealth: Payer: Self-pay

## 2020-01-17 NOTE — Telephone Encounter (Signed)
Called and spoke with pt directly. Called to go over recent lab results pt demonstrated understanding and had no questions.   "Please call the patient, CD 19, CD 20 did not show B-cell population, indicating Ocrevus is working." -NP [employer]

## 2020-02-08 ENCOUNTER — Telehealth: Payer: Self-pay

## 2020-02-08 NOTE — Telephone Encounter (Signed)
Fax discontinue questionnaire Tysabri to [phone]. Fax confirmed and receive.

## 2020-03-22 ENCOUNTER — Other Ambulatory Visit: Payer: Self-pay | Admitting: Neurology

## 2020-05-15 ENCOUNTER — Other Ambulatory Visit: Payer: Self-pay

## 2020-05-15 MED ORDER — OXYBUTYNIN CHLORIDE ER 5 MG PO TB24: 5.0000 mg | ORAL_TABLET | Freq: Every day | ORAL | 0 refills | Status: DC

## 2020-05-26 ENCOUNTER — Ambulatory Visit: Payer: Managed Care, Other (non HMO)

## 2020-06-19 ENCOUNTER — Other Ambulatory Visit: Payer: Self-pay | Admitting: *Deleted

## 2020-06-19 MED ORDER — OXYBUTYNIN CHLORIDE ER 5 MG PO TB24: 5.0000 mg | ORAL_TABLET | Freq: Every day | ORAL | 0 refills | Status: DC

## 2020-06-25 ENCOUNTER — Other Ambulatory Visit: Payer: Self-pay | Admitting: Neurology

## 2020-06-27 ENCOUNTER — Telehealth: Payer: Self-pay

## 2020-06-27 MED ORDER — MODAFINIL 200 MG PO TABS: 200.0000 mg | ORAL_TABLET | Freq: Every day | ORAL | 1 refills | Status: DC

## 2020-06-27 NOTE — Telephone Encounter (Signed)
The Provigil prescription will be refilled.

## 2020-07-11 ENCOUNTER — Other Ambulatory Visit: Payer: Self-pay

## 2020-07-11 ENCOUNTER — Encounter: Payer: Self-pay | Admitting: Neurology

## 2020-07-11 ENCOUNTER — Ambulatory Visit (INDEPENDENT_AMBULATORY_CARE_PROVIDER_SITE_OTHER): Payer: Managed Care, Other (non HMO) | Admitting: Neurology

## 2020-07-11 VITALS — BP 124/86 | HR 85 | Ht 73.0 in | Wt 309.4 lb

## 2020-07-11 DIAGNOSIS — G35 Multiple sclerosis: Secondary | ICD-10-CM | POA: Diagnosis not present

## 2020-07-11 DIAGNOSIS — R5383 Other fatigue: Secondary | ICD-10-CM | POA: Diagnosis not present

## 2020-07-11 MED ORDER — OXYBUTYNIN CHLORIDE ER 5 MG PO TB24: 5.0000 mg | ORAL_TABLET | Freq: Every day | ORAL | 3 refills | Status: DC

## 2020-07-11 MED ORDER — MECLIZINE HCL 25 MG PO TABS: ORAL_TABLET | ORAL | 6 refills | Status: DC

## 2020-07-11 NOTE — Progress Notes (Signed)
I have read the note, and I agree with the clinical assessment and plan.  Lakeisa Heninger K Ryenne Lynam   

## 2020-07-11 NOTE — Patient Instructions (Signed)
Order MRI of the brain and cervical spine Check blood work  Continue current medications Continue Ocrevus appointment  See you back in 6 months

## 2020-07-11 NOTE — Progress Notes (Signed)
PATIENT: Daniel Chavez DOB: 1976/10/06  REASON FOR VISIT: follow up HISTORY FROM: patient  HISTORY OF PRESENT ILLNESS: Today 07/11/20  Daniel Chavez is a 44 year old male with history of multiple sclerosis.  He has been switched to [place] in September 2020 from Tysabri due to JC viral antibody testing being positive. He likes Ocrevus, doesn't experience the highs and lows of Tysabri. He is on oxybutynin, meclizine, Provigil, and Ambien (PRN).  MRI of the brain and cervical spine in August 2020 showed overall good stability compared to prior in 2019.  MS overall stable, denies any new or worsening symptoms.  Next Ocrevus is in September 21, 2020. Some near falls, but none actually.  Working on losing weight.  Looking forward to going on vacation next week.  Presents today for evaluation unaccompanied.  HISTORY 01/12/2020 SS: Daniel Chavez is a 44 year old male with history of multiple sclerosis.  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  He received his first Ocrevus infusion in September 2020, during that time, he developed Covid.  He received the second dose a few weeks after.  He has done well on Ocrevus thus far.  He has not noticed the highs and lows like he does with Tysabri.  He had MRI of the brain and cervical spine in August 2020 showing good stability from 1 year ago.  He says he has daily vertigo, takes meclizine for this.  He does report urinary urgency, which he takes oxybutynin for.  He remains on Provigil.  He denies any numbness or weakness in his arms or legs, or changes to his vision.  He has not had any MS related falls.  He continues to work full-time at a [employer].  He is married, has 3 daughters.  He has Ambien that he may take as needed.  He presents today for evaluation unaccompanied.   REVIEW OF SYSTEMS: Out of a complete 14 system review of symptoms, the patient complains only of the following symptoms, and  all other reviewed systems are negative.  N/A  ALLERGIES: No Known Allergies  HOME MEDICATIONS: Outpatient Medications Prior to Visit  Medication Sig Dispense Refill  . acetaminophen (TYLENOL) 325 MG tablet Take 325 mg by mouth every 6 (six) hours as needed for mild pain or headache.    [name] amLODipine (NORVASC) 10 MG tablet Take 10 mg by mouth daily.    [name] ibuprofen (ADVIL,MOTRIN) 200 MG tablet Take 400-600 mg by mouth every 8 (eight) hours as needed for headache or mild pain (depends on pain if takes 2-3 tablets).    . modafinil (PROVIGIL) 200 MG tablet Take 1 tablet (200 mg total) by mouth daily. 90 tablet 1  . ocrelizumab (OCREVUS) 300 MG/10ML injection Inject 600 mg into the vein See admin instructions. Inject every 6 months per patient    . PRESCRIPTION MEDICATION Take 1 tablet by mouth daily. Buproprion, pt unsure of dose. Pharmacy has no record    . zolpidem (AMBIEN) 10 MG tablet TAKE ONE TABLET BY MOUTH AT BEDTIME AS NEEDED FOR SLEEP (Patient taking differently: Take 10 mg by mouth at bedtime as needed for sleep. ) 30 tablet 3  . meclizine (ANTIVERT) 25 MG tablet TAKE ONE TABLET BY MOUTH FOUR TIMES A DAY AS NEEDED FOR DIZZINESS 120 tablet 6  . oxybutynin (DITROPAN-XL) 5 MG 24 hr tablet Take 1 tablet (5 mg total) by mouth at bedtime. 90 tablet 0  .  amLODipine (NORVASC) 5 MG tablet Take 5 mg by mouth daily.     . mirabegron ER (MYRBETRIQ) 25 MG TB24 tablet Take 1 tablet (25 mg total) by mouth daily. 30 tablet 3   No facility-administered medications prior to visit.    PAST MEDICAL HISTORY: Past Medical History:  Diagnosis Date  . History of hiatal hernia   . MS (multiple sclerosis) ([place])   . Multiple sclerosis ([place]) 09/22/2013  . Obesity   . Optic neuritis, left     PAST SURGICAL HISTORY: Past Surgical History:  Procedure Laterality Date  . HERNIA REPAIR     2005/2007 Left siide hiatal hernia  . INCISIONAL HERNIA REPAIR N/A 10/20/2017   Procedure: REPAIR OF RECURRENT  INCISIONAL HERNIA WITH MESH;  Surgeon: Georganna Skeans, MD;  Location: [facility];  Service: General;  Laterality: N/A;  . INSERTION OF MESH N/A 10/20/2017   Procedure: INSERTION OF MESH;  Surgeon: Georganna Skeans, MD;  Location: [facility] OR;  Service: General;  Laterality: N/A;    FAMILY HISTORY: Family History  Problem Relation Age of Onset  . Leukemia Sister     SOCIAL HISTORY: Social History   Socioeconomic History  . Marital status: Married    Spouse name: Not on file  . Number of children: 3  . Years of education: hs  . Highest education level: Not on file  Occupational History  . Not on file  Tobacco Use  . Smoking status: Former Smoker    Quit date: 10/15/2008    Years since quitting: 11.7  . Smokeless tobacco: Never Used  . Tobacco comment: quit 03/2006  Vaping Use  . Vaping Use: Never used  Substance and Sexual Activity  . Alcohol use: No  . Drug use: No  . Sexual activity: Not on file  Other Topics Concern  . Not on file  Social History Narrative   Lives with wife   Caffeine use: Coffee (1-2 cups per day)   Left handed   Social Determinants of Health   Financial Resource Strain:   . Difficulty of Paying Living Expenses:   Food Insecurity:   . Worried About Charity fundraiser in the Last Year:   . Arboriculturist in the Last Year:   Transportation Needs:   . Film/video editor (Medical):   [name] Lack of Transportation (Non-Medical):   Physical Activity:   . Days of Exercise per Week:   . Minutes of Exercise per Session:   Stress:   . Feeling of Stress :   Social Connections:   . Frequency of Communication with Friends and Family:   . Frequency of Social Gatherings with Friends and Family:   . Attends Religious Services:   . Active Member of Clubs or Organizations:   . Attends Archivist Meetings:   [name] Marital Status:   Intimate Partner Violence:   . Fear of Current or Ex-Partner:   . Emotionally Abused:   [name] Physically Abused:   . Sexually  Abused:    PHYSICAL EXAM  Vitals:   07/11/20 1336  BP: 124/86  Pulse: 85  Weight: (!) 309 lb 6.4 oz (140.3 kg)  Height: 6' 1"  (1.854 m)   Body mass index is 40.82 kg/m.  Generalized: Well developed, in no acute distress   Neurological examination  Mentation: Alert oriented to time, place, history taking. Follows all commands speech and language fluent Cranial nerve II-XII: Pupils were equal round reactive to light. Extraocular movements were full, visual field were  full on confrontational test. Facial sensation and strength were normal.  Head turning and shoulder shrug  were normal and symmetric. Motor: The motor testing reveals 5 over 5 strength of all 4 extremities. Good symmetric motor tone is noted throughout.  Sensory: Sensory testing is intact to soft touch on all 4 extremities. No evidence of extinction is noted.  Coordination: Cerebellar testing reveals good finger-nose-finger and heel-to-shin bilaterally.  Gait and station: Gait is normal. Tandem gait is normal.  Reflexes: Deep tendon reflexes are symmetric and normal bilaterally.   DIAGNOSTIC DATA (LABS, IMAGING, TESTING) - I reviewed patient records, labs, notes, testing and imaging myself where available.  Lab Results  Component Value Date   WBC 4.8 09/05/2019   HGB 15.2 09/05/2019   HCT 45.3 09/05/2019   MCV 89.5 09/05/2019   PLT 174 09/05/2019      Component Value Date/Time   NA 142 01/12/2020 1347   K 4.5 01/12/2020 1347   CL 104 01/12/2020 1347   CO2 21 01/12/2020 1347   GLUCOSE 83 01/12/2020 1347   GLUCOSE 127 (H) 09/05/2019 1857   BUN 14 01/12/2020 1347   CREATININE 0.92 01/12/2020 1347   CALCIUM 10.0 01/12/2020 1347   PROT 7.2 01/12/2020 1347   ALBUMIN 4.6 01/12/2020 1347   AST 25 01/12/2020 1347   ALT 34 01/12/2020 1347   ALKPHOS 73 01/12/2020 1347   BILITOT 0.3 01/12/2020 1347   GFRNONAA 100 01/12/2020 1347   GFRAA 116 01/12/2020 1347   No results found for: CHOL, HDL, LDLCALC, LDLDIRECT,  TRIG, CHOLHDL No results found for: HGBA1C Lab Results  Component Value Date   VITAMINB12 959 07/30/2017   No results found for: TSH  ASSESSMENT AND PLAN 44 y.o. year old male  has a past medical history of History of hiatal hernia, MS (multiple sclerosis) ([place]), Multiple sclerosis ([place]) (09/22/2013), Obesity, and Optic neuritis, left. here with:  1.  Multiple sclerosis, relapsing remitting 2.  Fatigue  He continues to remain stable on Ocrevus.  I will order MRI of the brain and cervical spine with and without contrast.  We will check routine blood work, along with IgA, IgG, and IgM.  He will remain on meclizine, Provigil, oxybutynin, and Ambien (PRN).  He will follow-up in 6 months or sooner if needed.  I spent 30 minutes of face-to-face and non-face-to-face time with patient.  This included previsit chart review, lab review, study review, order entry, electronic health record documentation, patient education.   Butler Denmark, AGNP-C, DNP 07/11/2020, 2:08 PM [facility] [address] [place], [place] [address] [phone]

## 2020-07-12 ENCOUNTER — Telehealth: Payer: Self-pay | Admitting: Neurology

## 2020-07-12 LAB — COMPREHENSIVE METABOLIC PANEL
ALT: 27 IU/L (ref 0–44)
AST: 19 IU/L (ref 0–40)
Albumin/Globulin Ratio: 1.9 (ref 1.2–2.2)
Albumin: 4.6 g/dL (ref 4.0–5.0)
Alkaline Phosphatase: 73 IU/L (ref 48–121)
BUN/Creatinine Ratio: 15 (ref 9–20)
BUN: 16 mg/dL (ref 6–24)
Bilirubin Total: 0.4 mg/dL (ref 0.0–1.2)
CO2: 25 mmol/L (ref 20–29)
Calcium: 9.8 mg/dL (ref 8.7–10.2)
Chloride: 105 mmol/L (ref 96–106)
Creatinine, Ser: 1.06 mg/dL (ref 0.76–1.27)
GFR calc Af Amer: 97 mL/min/{1.73_m2} (ref >59)
GFR calc non Af Amer: 84 mL/min/{1.73_m2} (ref >59)
Globulin, Total: 2.4 g/dL (ref 1.5–4.5)
Glucose: 82 mg/dL (ref 65–99)
Potassium: 4.9 mmol/L (ref 3.5–5.2)
Sodium: 142 mmol/L (ref 134–144)
Total Protein: 7 g/dL (ref 6.0–8.5)

## 2020-07-12 LAB — IGG, IGA, IGM
IgA/Immunoglobulin A, Serum: 386 mg/dL (ref 90–386)
IgG (Immunoglobin G), Serum: 816 mg/dL (ref 603–1613)
IgM (Immunoglobulin M), Srm: 10 mg/dL — ABNORMAL LOW (ref 20–172)

## 2020-07-12 LAB — CBC WITH DIFFERENTIAL/PLATELET
Basophils Absolute: 0.1 10*3/uL (ref 0.0–0.2)
Basos: 1 %
EOS (ABSOLUTE): 0.1 10*3/uL (ref 0.0–0.4)
Eos: 2 %
Hematocrit: 44.9 % (ref 37.5–51.0)
Hemoglobin: 15.2 g/dL (ref 13.0–17.7)
Immature Grans (Abs): 0 10*3/uL (ref 0.0–0.1)
Immature Granulocytes: 0 %
Lymphocytes Absolute: 1.5 10*3/uL (ref 0.7–3.1)
Lymphs: 24 %
MCH: 30.6 pg (ref 26.6–33.0)
MCHC: 33.9 g/dL (ref 31.5–35.7)
MCV: 90 fL (ref 79–97)
Monocytes Absolute: 0.6 10*3/uL (ref 0.1–0.9)
Monocytes: 9 %
Neutrophils Absolute: 4.2 10*3/uL (ref 1.4–7.0)
Neutrophils: 64 %
Platelets: 233 10*3/uL (ref 150–450)
RBC: 4.97 x10E6/uL (ref 4.14–5.80)
RDW: 12.6 % (ref 11.6–15.4)
WBC: 6.5 10*3/uL (ref 3.4–10.8)

## 2020-07-12 NOTE — Telephone Encounter (Signed)
Labs stable, but IgM is lower than desired (worry when less than 20), discussed with Dr. Jannifer Franklin, will adjust Ocrevus doing to every 8 months. Reason, when IgM gets too low, at higher risk for life threatening infection. Please adjust this with intrafusion and call the patient.

## 2020-07-13 ENCOUNTER — Encounter: Payer: Self-pay | Admitting: Neurology

## 2020-07-13 NOTE — Telephone Encounter (Signed)
I called pt and relayed the lab results to him.  IgM low at 10,  Per MD and SS/NP recommend increasing interval between dosings from 56month to 8 months.  He verbalized understanding.  I relayed will notify intrafusion and is not heard in one week then [facility]

## 2020-07-17 ENCOUNTER — Telehealth: Payer: Self-pay | Admitting: Neurology

## 2020-07-17 NOTE — Telephone Encounter (Signed)
[name]: [id number] (exp. 07/13/20 to 10/11/20) patient is scheduled at GI for 08/09/20.

## 2020-08-06 ENCOUNTER — Other Ambulatory Visit: Payer: Managed Care, Other (non HMO)

## 2020-08-09 ENCOUNTER — Other Ambulatory Visit: Payer: Managed Care, Other (non HMO)

## 2020-08-09 ENCOUNTER — Ambulatory Visit
Admission: RE | Admit: 2020-08-09 | Discharge: 2020-08-09 | Disposition: A | Discharge status: Home / Self Care | Payer: Managed Care, Other (non HMO) | Source: Ambulatory Visit | Attending: Neurology | Admitting: Neurology

## 2020-08-09 DIAGNOSIS — G35 Multiple sclerosis: Secondary | ICD-10-CM | POA: Diagnosis not present

## 2020-08-09 MED ORDER — GADOBENATE DIMEGLUMINE 529 MG/ML IV SOLN
20.0000 mL | Freq: Once | INTRAVENOUS | Status: AC | PRN
  Administered 2020-08-09: 20 mL via INTRAVENOUS

## 2020-08-15 ENCOUNTER — Telehealth: Payer: Self-pay

## 2020-08-15 NOTE — Telephone Encounter (Signed)
-----   Message from Suzzanne Cloud, NP sent at 08/14/2020  9:21 AM EDT ----- Please call the patient.  MRI of the brain and cervical spine is overall stable.  MRI of cervical spine showed no acute plaques, no new plaques since July 2019.  MRI of the brain is stable, no acute plaques.  MRI cervical spine with and without contrast demonstrating: -Chronic demyelinating plaque at C5-6. -No acute plaques. No new plaques since 06/23/18.  -Mild degenerative changes and foraminal stenosis as above.  MRI brain (with and without) demonstrating: -Multiple stable supratentorial and infratentorial chronic demyelinating plaques. -No acute plaques.

## 2020-08-15 NOTE — Telephone Encounter (Signed)
Pt verified by name and DOB, results given per provider, pt voiced understanding all question answered. 

## 2020-09-17 ENCOUNTER — Encounter: Payer: Self-pay | Admitting: Neurology

## 2020-11-06 ENCOUNTER — Telehealth: Payer: Self-pay | Admitting: *Deleted

## 2020-11-06 NOTE — Telephone Encounter (Signed)
Daniel Chavez spoke with pt this am. Has Ocrevus infusion scheduled for this Thursday. However, pt daughter tested positive for covid-19 on 10/30/20. Wife tested positive 11/04/20. He has been in a hotel since daughter tested positive on 10/30/20.  Per Dr. Felecia Shelling: He would like him to have no contact within the last 10 days and have a negative covid test within 48-72 hour prior to infusion. ok to push out infusion a week. I relayed this to [place]. They will get him r/s.

## 2020-11-15 ENCOUNTER — Encounter: Payer: Self-pay | Admitting: Neurology

## 2020-12-27 LAB — COLOGUARD: COLOGUARD: NEGATIVE

## 2020-12-31 ENCOUNTER — Encounter: Payer: Self-pay | Admitting: Neurology

## 2020-12-31 ENCOUNTER — Other Ambulatory Visit: Payer: Self-pay | Admitting: Neurology

## 2021-01-09 ENCOUNTER — Encounter: Payer: Self-pay | Admitting: Neurology

## 2021-01-11 ENCOUNTER — Encounter: Payer: Self-pay | Admitting: Neurology

## 2021-01-11 ENCOUNTER — Ambulatory Visit (INDEPENDENT_AMBULATORY_CARE_PROVIDER_SITE_OTHER): Payer: Managed Care, Other (non HMO) | Admitting: Neurology

## 2021-01-11 ENCOUNTER — Telehealth: Payer: Self-pay

## 2021-01-11 VITALS — BP 139/91 | HR 79 | Ht 73.0 in | Wt 315.0 lb

## 2021-01-11 DIAGNOSIS — G35 Multiple sclerosis: Secondary | ICD-10-CM

## 2021-01-11 MED ORDER — MECLIZINE HCL 25 MG PO TABS: ORAL_TABLET | ORAL | 6 refills | Status: DC

## 2021-01-11 NOTE — Progress Notes (Signed)
I have read the note, and I agree with the clinical assessment and plan.  Charles K Willis   

## 2021-01-11 NOTE — Telephone Encounter (Signed)
PA for Modafinil 200MG tablets Approved;Review Type:Prior Auth;Coverage Start Date:01/11/2021;Coverage End Date:01/11/2022

## 2021-01-11 NOTE — Patient Instructions (Signed)
Continue the ocrevus Check labs  Continue current medications See you back in 6 months

## 2021-01-11 NOTE — Progress Notes (Signed)
PATIENT: Daniel Chavez DOB: 01-10-1976  REASON FOR VISIT: follow up HISTORY FROM: patient  HISTORY OF PRESENT ILLNESS: Today 01/11/21  Daniel Chavez is a 45 year old male with history of multiple sclerosis.  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  He is doing well on Ocrevus.  He is also on oxybutynin, meclizine, Provigil, and Ambien (rarely takes).  In July, IgM was low, changed Ocrevus dosing to every 8 months, infused November 2021, prior was March 09, 2020.  In August 2021 MRI of the brain and cervical spine were overall stable. He acquired COVID in mid December, had mild symptoms of cough. MS overall stable. Does miss the "high" energy he felt from Tysabri, that Ocrevus lacks. Continues to work full-time. No changes to the balance or walking, no falls. Does have chronic dizziness, takes meclizine. Is requiring readers now. Here today for follow-up unaccompanied.  HISTORY 07/11/2020 SS: Daniel Chavez is a 45 year old male with history of multiple sclerosis.  He has been switched to [place] in September 2020 from [place] due to JC viral antibody testing being positive. He likes Ocrevus, doesn't experience the highs and lows of Tysabri. He is on oxybutynin, meclizine, Provigil, and Ambien (PRN).  MRI of the brain and cervical spine in August 2020 showed overall good stability compared to prior in 2019.  MS overall stable, denies any new or worsening symptoms.  Next Ocrevus is in September 21, 2020. Some near falls, but none actually.  Working on losing weight.  Looking forward to going on vacation next week.  Presents today for evaluation unaccompanied.   REVIEW OF SYSTEMS: Out of a complete 14 system review of symptoms, the patient complains only of the following symptoms, and all other reviewed systems are negative.  N/A  ALLERGIES: No Known Allergies  HOME MEDICATIONS: Outpatient Medications Prior to Visit   Medication Sig Dispense Refill  . acetaminophen (TYLENOL) 325 MG tablet Take 325 mg by mouth every 6 (six) hours as needed for mild pain or headache.    [name] amLODipine (NORVASC) 10 MG tablet Take 10 mg by mouth daily.    [name] ibuprofen (ADVIL,MOTRIN) 200 MG tablet Take 400-600 mg by mouth every 8 (eight) hours as needed for headache or mild pain (depends on pain if takes 2-3 tablets).    . modafinil (PROVIGIL) 200 MG tablet TAKE ONE TABLET BY MOUTH DAILY 90 tablet 1  . ocrelizumab (OCREVUS) 300 MG/10ML injection Inject 600 mg into the vein See admin instructions. Inject every 6 months per patient    . oxybutynin (DITROPAN-XL) 5 MG 24 hr tablet Take 1 tablet (5 mg total) by mouth at bedtime. 90 tablet 3  . PRESCRIPTION MEDICATION Take 1 tablet by mouth daily. Buproprion, pt unsure of dose. Pharmacy has no record    . zolpidem (AMBIEN) 10 MG tablet TAKE ONE TABLET BY MOUTH AT BEDTIME AS NEEDED FOR SLEEP (Patient taking differently: Take 10 mg by mouth at bedtime as needed for sleep. ) 30 tablet 3  . meclizine (ANTIVERT) 25 MG tablet TAKE ONE TABLET BY MOUTH FOUR TIMES A DAY AS NEEDED FOR DIZZINESS 120 tablet 6   No facility-administered medications prior to visit.    PAST MEDICAL HISTORY: Past Medical History:  Diagnosis Date  . History of hiatal hernia   . MS (multiple sclerosis) ([place])   . Multiple sclerosis ([place]) 09/22/2013  . Obesity   . Optic neuritis, left     PAST  SURGICAL HISTORY: Past Surgical History:  Procedure Laterality Date  . HERNIA REPAIR     2005/2007 Left siide hiatal hernia  . INCISIONAL HERNIA REPAIR N/A 10/20/2017   Procedure: REPAIR OF RECURRENT INCISIONAL HERNIA WITH MESH;  Surgeon: Georganna Skeans, MD;  Location: [facility];  Service: General;  Laterality: N/A;  . INSERTION OF MESH N/A 10/20/2017   Procedure: INSERTION OF MESH;  Surgeon: Georganna Skeans, MD;  Location: [facility] OR;  Service: General;  Laterality: N/A;    FAMILY HISTORY: Family History  Problem Relation Age  of Onset  . Leukemia Sister     SOCIAL HISTORY: Social History   Socioeconomic History  . Marital status: Married    Spouse name: Not on file  . Number of children: 3  . Years of education: hs  . Highest education level: Not on file  Occupational History  . Not on file  Tobacco Use  . Smoking status: Former Smoker    Quit date: 10/15/2008    Years since quitting: 12.2  . Smokeless tobacco: Never Used  . Tobacco comment: quit 03/2006  Vaping Use  . Vaping Use: Never used  Substance and Sexual Activity  . Alcohol use: No  . Drug use: No  . Sexual activity: Not on file  Other Topics Concern  . Not on file  Social History Narrative   Lives with wife   Caffeine use: Coffee (1-2 cups per day)   Left handed   Social Determinants of Health   Financial Resource Strain: Not on file  Food Insecurity: Not on file  Transportation Needs: Not on file  Physical Activity: Not on file  Stress: Not on file  Social Connections: Not on file  Intimate Partner Violence: Not on file   PHYSICAL EXAM  Vitals:   01/11/21 1336  BP: (!) 139/91  Pulse: 79  Weight: (!) 315 lb (142.9 kg)  Height: 6' 1"  (1.854 m)   Body mass index is 41.56 kg/m.  Generalized: Well developed, in no acute distress   Neurological examination  Mentation: Alert oriented to time, place, history taking. Follows all commands speech and language fluent Cranial nerve II-XII: Pupils were equal round reactive to light. Extraocular movements were full, visual field were full on confrontational test. Facial sensation and strength were normal.  Head turning and shoulder shrug  were normal and symmetric. Motor: The motor testing reveals 5 over 5 strength of all 4 extremities. Good symmetric motor tone is noted throughout.  Sensory: Sensory testing is intact to soft touch on all 4 extremities. No evidence of extinction is noted.  Coordination: Cerebellar testing reveals good finger-nose-finger and heel-to-shin  bilaterally.  Gait and station: Gait is normal. Tandem gait is normal.  Reflexes: Deep tendon reflexes are symmetric and normal bilaterally.   DIAGNOSTIC DATA (LABS, IMAGING, TESTING) - I reviewed patient records, labs, notes, testing and imaging myself where available.  Lab Results  Component Value Date   WBC 6.5 07/11/2020   HGB 15.2 07/11/2020   HCT 44.9 07/11/2020   MCV 90 07/11/2020   PLT 233 07/11/2020      Component Value Date/Time   NA 142 07/11/2020 1437   K 4.9 07/11/2020 1437   CL 105 07/11/2020 1437   CO2 25 07/11/2020 1437   GLUCOSE 82 07/11/2020 1437   GLUCOSE 127 (H) 09/05/2019 1857   BUN 16 07/11/2020 1437   CREATININE 1.06 07/11/2020 1437   CALCIUM 9.8 07/11/2020 1437   PROT 7.0 07/11/2020 1437   ALBUMIN 4.6 07/11/2020  1437   AST 19 07/11/2020 1437   ALT 27 07/11/2020 1437   ALKPHOS 73 07/11/2020 1437   BILITOT 0.4 07/11/2020 1437   GFRNONAA 84 07/11/2020 1437   GFRAA 97 07/11/2020 1437   No results found for: CHOL, HDL, LDLCALC, LDLDIRECT, TRIG, CHOLHDL No results found for: HGBA1C Lab Results  Component Value Date   VITAMINB12 959 07/30/2017   No results found for: TSH  ASSESSMENT AND PLAN 45 y.o. year old male  has a past medical history of History of hiatal hernia, MS (multiple sclerosis) ([place]), Multiple sclerosis ([place]) (09/22/2013), Obesity, and Optic neuritis, left. here with:  1.  Multiple sclerosis, relapsing remitting 2.  Fatigue  He remains overall stable, he will remain on Ocrevus. We have pushed the Ocrevus out to every 8 months due to low IgM. I will recheck these levels today, along with CBC, CMP. MRI of the brain and cervical spine were overall stable in August 2021. He will remain on meclizine, Provigil, oxybutynin, and Ambien (as needed). He will follow-up in 6 months or sooner if needed with Dr. Jannifer Franklin, wants to discuss switching to Dr. Felecia Shelling when Dr. Jamey Ripa.  I spent 30 minutes of face-to-face and non-face-to-face time with  patient.  This included previsit chart review, lab review, study review, order entry, electronic health record documentation, patient education.   Butler Denmark, AGNP-C, DNP 01/11/2021, 2:06 PM [facility] [address] [place], [place] [address] [phone]

## 2021-01-12 LAB — CBC WITH DIFFERENTIAL/PLATELET
Basophils Absolute: 0.1 10*3/uL (ref 0.0–0.2)
Basos: 1 %
EOS (ABSOLUTE): 0.1 10*3/uL (ref 0.0–0.4)
Eos: 1 %
Hematocrit: 43.9 % (ref 37.5–51.0)
Hemoglobin: 15.4 g/dL (ref 13.0–17.7)
Immature Grans (Abs): 0 10*3/uL (ref 0.0–0.1)
Immature Granulocytes: 0 %
Lymphocytes Absolute: 1.5 10*3/uL (ref 0.7–3.1)
Lymphs: 20 %
MCH: 30.3 pg (ref 26.6–33.0)
MCHC: 35.1 g/dL (ref 31.5–35.7)
MCV: 86 fL (ref 79–97)
Monocytes Absolute: 0.7 10*3/uL (ref 0.1–0.9)
Monocytes: 9 %
Neutrophils Absolute: 5 10*3/uL (ref 1.4–7.0)
Neutrophils: 69 %
Platelets: 297 10*3/uL (ref 150–450)
RBC: 5.09 x10E6/uL (ref 4.14–5.80)
RDW: 12 % (ref 11.6–15.4)
WBC: 7.3 10*3/uL (ref 3.4–10.8)

## 2021-01-12 LAB — COMPREHENSIVE METABOLIC PANEL
ALT: 44 IU/L (ref 0–44)
AST: 26 IU/L (ref 0–40)
Albumin/Globulin Ratio: 1.9 (ref 1.2–2.2)
Albumin: 4.5 g/dL (ref 4.0–5.0)
Alkaline Phosphatase: 74 IU/L (ref 44–121)
BUN/Creatinine Ratio: 11 (ref 9–20)
BUN: 13 mg/dL (ref 6–24)
Bilirubin Total: 0.4 mg/dL (ref 0.0–1.2)
CO2: 25 mmol/L (ref 20–29)
Calcium: 9.8 mg/dL (ref 8.7–10.2)
Chloride: 104 mmol/L (ref 96–106)
Creatinine, Ser: 1.16 mg/dL (ref 0.76–1.27)
GFR calc Af Amer: 87 mL/min/{1.73_m2} (ref >59)
GFR calc non Af Amer: 75 mL/min/{1.73_m2} (ref >59)
Globulin, Total: 2.4 g/dL (ref 1.5–4.5)
Glucose: 75 mg/dL (ref 65–99)
Potassium: 4.7 mmol/L (ref 3.5–5.2)
Sodium: 144 mmol/L (ref 134–144)
Total Protein: 6.9 g/dL (ref 6.0–8.5)

## 2021-01-12 LAB — IGG, IGA, IGM
IgA/Immunoglobulin A, Serum: 391 mg/dL — ABNORMAL HIGH (ref 90–386)
IgG (Immunoglobin G), Serum: 805 mg/dL (ref 603–1613)
IgM (Immunoglobulin M), Srm: 10 mg/dL — ABNORMAL LOW (ref 20–172)

## 2021-01-15 ENCOUNTER — Encounter: Payer: Self-pay | Admitting: Neurology

## 2021-01-15 NOTE — Telephone Encounter (Signed)
-----   Message from Suzzanne Cloud, NP sent at 01/15/2021 11:51 AM EST ----- Sent my chart message: Barnie Alderman are stable. Let's continue the Ocrevus every 8 month intervals due to low IgM, can put you at higher risk for infection. Let me know if you have any questions! IgM is stable at 10 (ideally not less than 20). Take Care, Judson Roch

## 2021-01-22 ENCOUNTER — Telehealth: Payer: Self-pay | Admitting: Neurology

## 2021-01-22 NOTE — Telephone Encounter (Signed)
I called patient.  He is agreeable to an appointment on May 02, 2021 at 7:30 AM with Dr. Jannifer Franklin.  We will also keep his appointment as previously scheduled in July.  Patient verbalized understanding of new appointment date and time.

## 2021-01-22 NOTE — Telephone Encounter (Signed)
I talked to Dr. Jannifer Franklin, his IgM continues to be low level 10, last Ocrevus was Nov 2021, we have switched to every 8 month interval in July 2021. We need to move his appointment up to May 2022 time frame, he needs to be seen before his next scheduled Ocrevus infusion so Dr. Jannifer Franklin can check labs make a decision about whether to switch to something like [place] or remain on Ocrevus. I called the patient and let him know.  Doroteo Bradford, can you get him an appointment in May 2022 timeframe?

## 2021-04-30 ENCOUNTER — Encounter: Payer: Self-pay | Admitting: Neurology

## 2021-04-30 ENCOUNTER — Telehealth: Payer: Self-pay | Admitting: Neurology

## 2021-04-30 DIAGNOSIS — Z5181 Encounter for therapeutic drug level monitoring: Secondary | ICD-10-CM

## 2021-04-30 NOTE — Telephone Encounter (Signed)
Called patient offered 06/13/21 @ 0730 with arrival time of 715.  Patient acknowledged and accepted appointment.  Patient denied further questions, verbalized understanding and expressed appreciation for the phone call.

## 2021-04-30 NOTE — Telephone Encounter (Signed)
I called the patient.  The patient is on Ocrevus getting 600 mg every 8 months or so, his IgM level has been somewhat low, IgG level was adequate.   We will have him come in for blood work now, if the IgM level remains low, we may have to reduce his dosing to 300 mg of Ocrevus every 6 months.  The patient inadvertently canceled his appointment this week, I will try to get another appointment for him.  Otherwise clinically, he claims that he is doing well.

## 2021-04-30 NOTE — Addendum Note (Signed)
Addended by: Kathrynn Ducking on: 04/30/2021 09:10 AM   Modules accepted: Orders

## 2021-04-30 NOTE — Telephone Encounter (Signed)
Pt states he cx his appointment in error, pt states that appointment was pretty important.  Pt would like a call re: what he can do about being seen(he was advised Dr Jannifer Franklin isn't showing to have anything)

## 2021-05-02 ENCOUNTER — Ambulatory Visit: Payer: Self-pay | Admitting: Neurology

## 2021-05-02 ENCOUNTER — Other Ambulatory Visit: Payer: Self-pay

## 2021-05-02 ENCOUNTER — Encounter: Payer: Self-pay | Admitting: Neurology

## 2021-05-02 ENCOUNTER — Ambulatory Visit (INDEPENDENT_AMBULATORY_CARE_PROVIDER_SITE_OTHER): Payer: Managed Care, Other (non HMO) | Admitting: Neurology

## 2021-05-02 VITALS — BP 131/88 | HR 74 | Ht 73.0 in | Wt 314.0 lb

## 2021-05-02 DIAGNOSIS — G35 Multiple sclerosis: Secondary | ICD-10-CM

## 2021-05-02 DIAGNOSIS — Z5181 Encounter for therapeutic drug level monitoring: Secondary | ICD-10-CM

## 2021-05-02 NOTE — Progress Notes (Signed)
Reason for visit: Multiple sclerosis  Daniel Chavez is an 45 y.o. male  History of present illness:  Daniel Chavez is a 45 year old left-handed white male with a history of multiple sclerosis.  The patient has been on Ocrevus, he tolerates the medication quite well, he has not had any clinical exacerbations of his multiple sclerosis on the medication.  He reports no new vision changes, numbness or weakness, or balance changes.  The patient is on Provigil for fatigue.  The patient is working full-time.  He has had low IgM levels on Ocrevus, his dosing has been cut back to every 8 months, his next dose is to occur on 26 July 2021.  The patient returns for an evaluation.  Past Medical History:  Diagnosis Date  . History of hiatal hernia   . MS (multiple sclerosis) (Daniel Chavez)   . Multiple sclerosis (Daniel Chavez) 09/22/2013  . Obesity   . Optic neuritis, left     Past Surgical History:  Procedure Laterality Date  . HERNIA REPAIR     2005/2007 Left siide hiatal hernia  . INCISIONAL HERNIA REPAIR N/A 10/20/2017   Procedure: REPAIR OF RECURRENT INCISIONAL HERNIA WITH MESH;  Surgeon: Georganna Skeans, MD;  Location: [facility];  Service: General;  Laterality: N/A;  . INSERTION OF MESH N/A 10/20/2017   Procedure: INSERTION OF MESH;  Surgeon: Georganna Skeans, MD;  Location: [facility];  Service: General;  Laterality: N/A;    Family History  Problem Relation Age of Onset  . Leukemia Sister     Social history:  reports that he quit smoking about 12 years ago. He has never used smokeless tobacco. He reports that he does not drink alcohol and does not use drugs.   No Known Allergies  Medications:  Prior to Admission medications   Medication Sig Start Date End Date Taking? Authorizing Provider  acetaminophen (TYLENOL) 325 MG tablet Take 325 mg by mouth every 6 (six) hours as needed for mild pain or headache.   Yes [provider]  amLODipine (NORVASC) 10 MG tablet Take 10 mg by mouth daily. 11/24/19   Yes [provider]  ibuprofen (ADVIL,MOTRIN) 200 MG tablet Take 400-600 mg by mouth every 8 (eight) hours as needed for headache or mild pain (depends on pain if takes 2-3 tablets).   Yes [provider]  meclizine (ANTIVERT) 25 MG tablet TAKE ONE TABLET BY MOUTH FOUR TIMES A DAY AS NEEDED FOR DIZZINESS 01/11/21  Yes Suzzanne Cloud, NP  modafinil (PROVIGIL) 200 MG tablet TAKE ONE TABLET BY MOUTH DAILY 01/01/21  Yes Suzzanne Cloud, NP  ocrelizumab (OCREVUS) 300 MG/10ML injection Inject 600 mg into the vein See admin instructions. Inject every 6 months per patient   Yes [provider]  oxybutynin (DITROPAN-XL) 5 MG 24 hr tablet Take 1 tablet (5 mg total) by mouth at bedtime. 07/11/20  Yes Suzzanne Cloud, NP  PRESCRIPTION MEDICATION Take 1 tablet by mouth daily. Buproprion, pt unsure of dose. Pharmacy has no record   Yes [provider]  rosuvastatin (CRESTOR) 20 MG tablet Take 20 mg by mouth at bedtime. 04/02/21  Yes [provider]  zolpidem (AMBIEN) 10 MG tablet TAKE ONE TABLET BY MOUTH AT BEDTIME AS NEEDED FOR SLEEP Patient taking differently: Take 10 mg by mouth at bedtime as needed for sleep. 03/08/19  Yes Kathrynn Ducking, MD    ROS:  Out of a complete 14 system review of symptoms, the patient complains only of the following  symptoms, and all other reviewed systems are negative.  Fatigue Heat sensitivity  Blood pressure 131/88, pulse 74, height 6' 1"  (1.854 m), weight (!) 314 lb (142.4 kg).  Physical Exam  General: The patient is alert and cooperative at the time of the examination.  The patient is obese.  Skin: No significant peripheral edema is noted.   Neurologic Exam  Mental status: The patient is alert and oriented x 3 at the time of the examination. The patient has apparent normal recent and remote memory, with an apparently normal attention span and concentration ability.   Cranial nerves: Facial symmetry is present. Speech is  normal, no aphasia or dysarthria is noted. Extraocular movements are full. Visual fields are full.  Pupils are equal, round, and reactive to light.  Discs are flat bilaterally.  Motor: The patient has good strength in all 4 extremities.  Sensory examination: Soft touch sensation is symmetric on the face, arms, and legs.  Coordination: The patient has good finger-nose-finger and heel-to-shin bilaterally.  Gait and station: The patient has a normal gait. Tandem gait is normal. Romberg is negative. No drift is seen.  Reflexes: Deep tendon reflexes are symmetric.   Assessment/Plan:  1.  Multiple sclerosis  The patient is doing well on Ocrevus.  Blood work will be done today.  If the Ig M levels remain low, we will cut back the dose of the Ocrevus to 300 mg IV every 6 months.  He is to get the COVID booster.  The patient will follow up in 6 months, we will repeat MRI of the brain and cervical spine at that time.  After my retirement, this may be a reasonable transfer to Dr. Felecia Shelling.  Jill Alexanders MD 05/02/2021 7:16 AM  [facility] [address] [place], [place] [address]  Phone [phone] Fax [phone]

## 2021-05-03 ENCOUNTER — Telehealth: Payer: Self-pay | Admitting: Neurology

## 2021-05-03 LAB — CBC WITH DIFFERENTIAL/PLATELET
Basophils Absolute: 0 10*3/uL (ref 0.0–0.2)
Basos: 1 %
EOS (ABSOLUTE): 0.1 10*3/uL (ref 0.0–0.4)
Eos: 1 %
Hematocrit: 47.7 % (ref 37.5–51.0)
Hemoglobin: 15.6 g/dL (ref 13.0–17.7)
Immature Grans (Abs): 0 10*3/uL (ref 0.0–0.1)
Immature Granulocytes: 0 %
Lymphocytes Absolute: 1.2 10*3/uL (ref 0.7–3.1)
Lymphs: 20 %
MCH: 29.7 pg (ref 26.6–33.0)
MCHC: 32.7 g/dL (ref 31.5–35.7)
MCV: 91 fL (ref 79–97)
Monocytes Absolute: 0.6 10*3/uL (ref 0.1–0.9)
Monocytes: 10 %
Neutrophils Absolute: 3.9 10*3/uL (ref 1.4–7.0)
Neutrophils: 68 %
Platelets: 234 10*3/uL (ref 150–450)
RBC: 5.26 x10E6/uL (ref 4.14–5.80)
RDW: 13.1 % (ref 11.6–15.4)
WBC: 5.8 10*3/uL (ref 3.4–10.8)

## 2021-05-03 LAB — IGG, IGA, IGM
IgA/Immunoglobulin A, Serum: 393 mg/dL — ABNORMAL HIGH (ref 90–386)
IgG (Immunoglobin G), Serum: 792 mg/dL (ref 603–1613)
IgM (Immunoglobulin M), Srm: 9 mg/dL — ABNORMAL LOW (ref 20–172)

## 2021-05-03 LAB — COMPREHENSIVE METABOLIC PANEL
ALT: 41 IU/L (ref 0–44)
AST: 23 IU/L (ref 0–40)
Albumin/Globulin Ratio: 1.7 (ref 1.2–2.2)
Albumin: 4.5 g/dL (ref 4.0–5.0)
Alkaline Phosphatase: 76 IU/L (ref 44–121)
BUN/Creatinine Ratio: 16 (ref 9–20)
BUN: 17 mg/dL (ref 6–24)
Bilirubin Total: 0.5 mg/dL (ref 0.0–1.2)
CO2: 25 mmol/L (ref 20–29)
Calcium: 10 mg/dL (ref 8.7–10.2)
Chloride: 105 mmol/L (ref 96–106)
Creatinine, Ser: 1.07 mg/dL (ref 0.76–1.27)
Globulin, Total: 2.6 g/dL (ref 1.5–4.5)
Glucose: 98 mg/dL (ref 65–99)
Potassium: 4.9 mmol/L (ref 3.5–5.2)
Sodium: 145 mmol/L — ABNORMAL HIGH (ref 134–144)
Total Protein: 7.1 g/dL (ref 6.0–8.5)
eGFR: 86 mL/min/{1.73_m2} (ref >59)

## 2021-05-03 NOTE — Telephone Encounter (Signed)
I called the patient.  The IgM level remains low, we will reduce the dose on the next infusion to 300 mg and then do the infusions every 6 months.

## 2021-06-13 ENCOUNTER — Ambulatory Visit: Payer: Self-pay | Admitting: Neurology

## 2021-07-08 ENCOUNTER — Encounter: Payer: Self-pay | Admitting: Neurology

## 2021-07-09 ENCOUNTER — Other Ambulatory Visit: Payer: Self-pay | Admitting: *Deleted

## 2021-07-09 MED ORDER — MODAFINIL 200 MG PO TABS: 200.0000 mg | ORAL_TABLET | Freq: Every day | ORAL | 1 refills | Status: DC

## 2021-07-12 ENCOUNTER — Ambulatory Visit: Payer: Managed Care, Other (non HMO) | Admitting: Neurology

## 2021-07-24 IMAGING — DX DG CHEST 1V PORT
1 series · 1 of 1 positions shown · non-contrast
Comparison: None.

CLINICAL DATA: Fevers for several days

EXAM:
PORTABLE CHEST 1 VIEW

[chest ap]
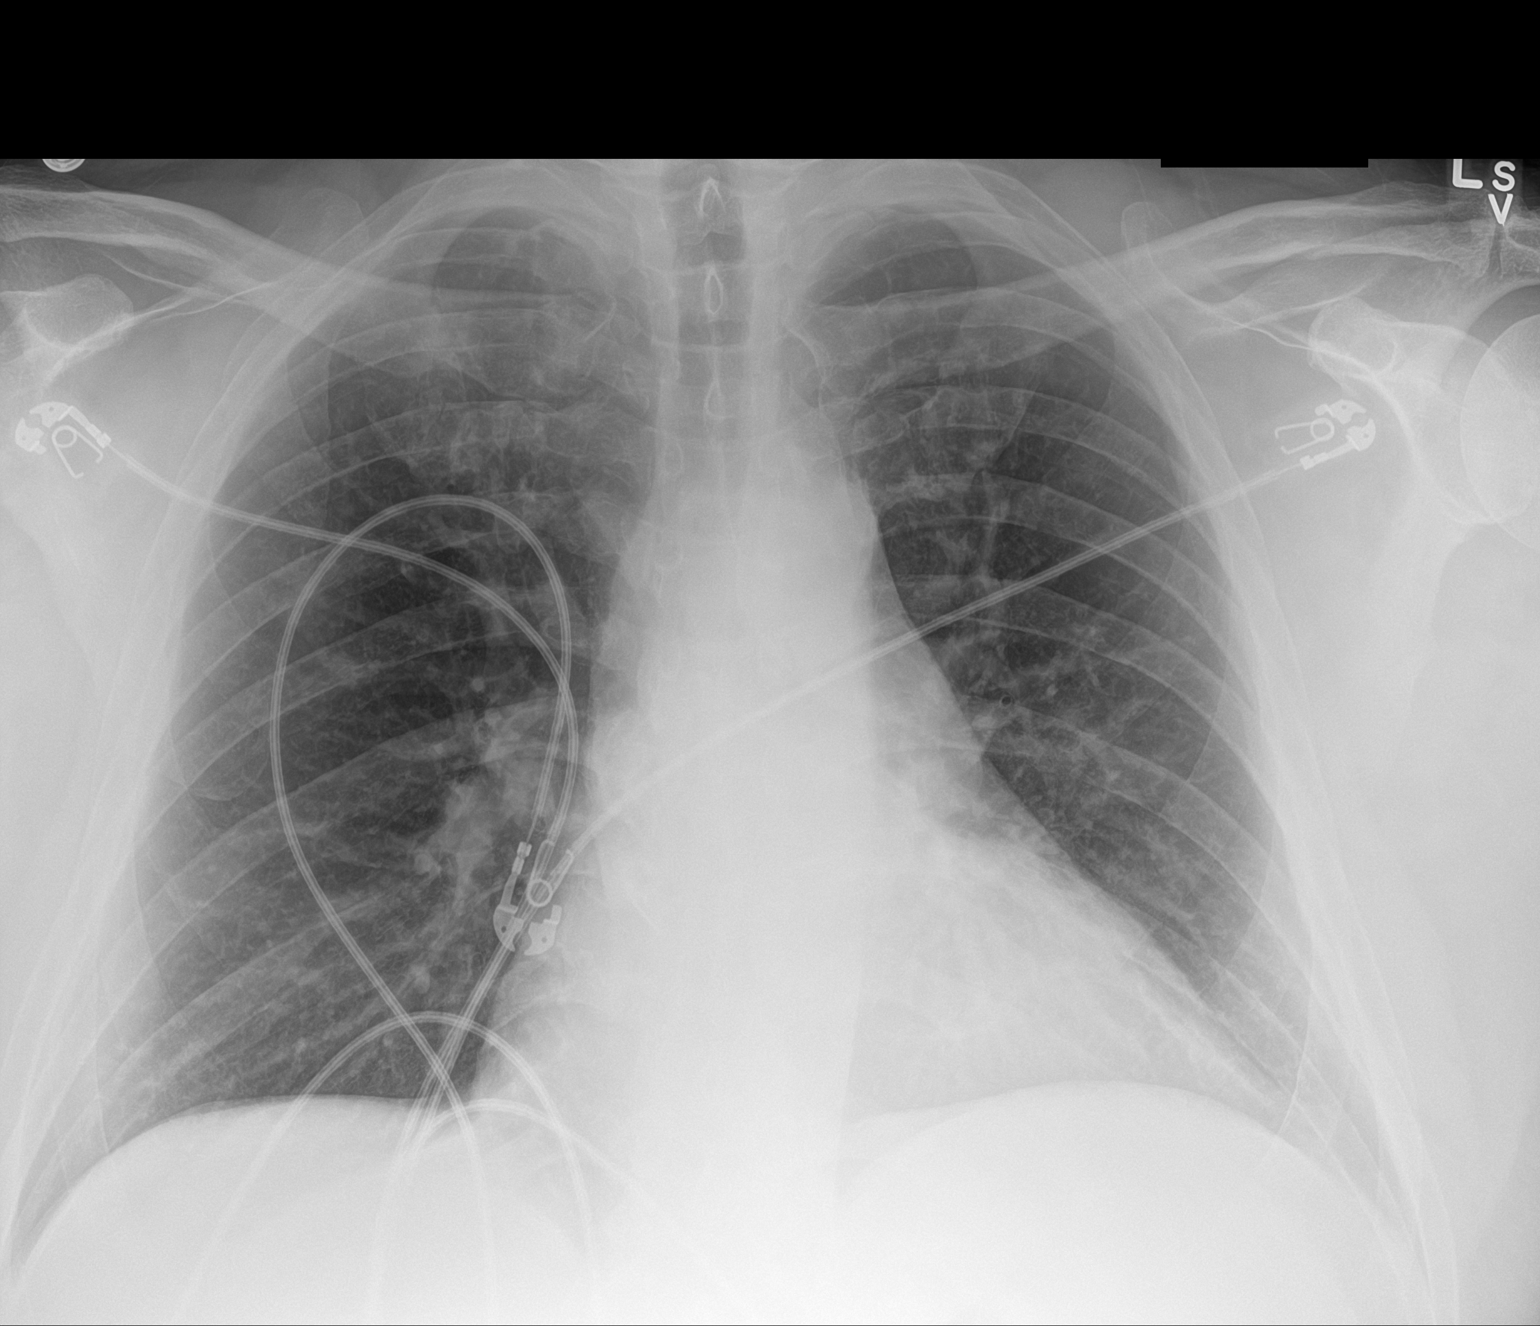

[1 of 1 positions shown; findings below may reference images not displayed]

FINDINGS: Cardiac shadow is mildly accentuated by the portable technique. The
lungs are clear bilaterally. No focal infiltrate or effusion is
noted. No bony abnormality is seen.
IMPRESSION: No active disease.

## 2021-09-11 ENCOUNTER — Other Ambulatory Visit: Payer: Self-pay | Admitting: Neurology

## 2021-11-20 ENCOUNTER — Ambulatory Visit: Payer: Managed Care, Other (non HMO) | Admitting: Neurology

## 2021-12-05 ENCOUNTER — Ambulatory Visit: Payer: Managed Care, Other (non HMO) | Admitting: Adult Health

## 2021-12-09 ENCOUNTER — Encounter: Payer: Self-pay | Admitting: Adult Health

## 2021-12-11 ENCOUNTER — Encounter: Payer: Self-pay | Admitting: Neurology

## 2021-12-11 ENCOUNTER — Ambulatory Visit (INDEPENDENT_AMBULATORY_CARE_PROVIDER_SITE_OTHER): Payer: Managed Care, Other (non HMO) | Admitting: Neurology

## 2021-12-11 VITALS — BP 145/92 | HR 78 | Ht 73.0 in | Wt 324.0 lb

## 2021-12-11 DIAGNOSIS — R5383 Other fatigue: Secondary | ICD-10-CM

## 2021-12-11 DIAGNOSIS — G35 Multiple sclerosis: Secondary | ICD-10-CM | POA: Diagnosis not present

## 2021-12-11 MED ORDER — MECLIZINE HCL 25 MG PO TABS: ORAL_TABLET | ORAL | 6 refills | Status: DC

## 2021-12-11 MED ORDER — MODAFINIL 200 MG PO TABS: 200.0000 mg | ORAL_TABLET | Freq: Every day | ORAL | 1 refills | Status: DC

## 2021-12-11 MED ORDER — ZOLPIDEM TARTRATE 10 MG PO TABS: 10.0000 mg | ORAL_TABLET | Freq: Every evening | ORAL | 1 refills | Status: AC | PRN

## 2021-12-11 NOTE — Patient Instructions (Signed)
Check MRI of the brain and cervical spine Check labs today  Will discuss schedule for Ocrevus once labs return  Will see you back in 6 months with Dr. Felecia Shelling

## 2021-12-11 NOTE — Progress Notes (Addendum)
PATIENT: Daniel Chavez DOB: 17-Mar-1976  REASON FOR VISIT: Follow up for MS HISTORY FROM: Patient and his wife PRIMARY NEUROLOGIST: Dr. Felecia Shelling now that Dr. Jannifer Franklin has retired  HISTORY OF PRESENT ILLNESS: Today 12/11/21 Daniel Chavez is here today for follow-up with history of MS on Ocrevus.  His IgM levels were low, we have cut the dose of Ocrevus to 300 mg every 6 months, last was in July 2022.  Is on modafinil for fatigue.  MRI of the brain and cervical spine were last done in August 2021, were stable. Next infusion isn't scheduled until march.  Remains on meclizine 3 times daily for dizziness when standing.  Has been chronic symptoms since relapse in 2018.  Takes modafinil for fatigue with good benefit.  Needs refill of Ambien PRN for sleep. No recent falls, feels right side is slightly weaker chronically.  Also on oxybutynin for urinary urgency with good benefit. 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   HISTORY  05/02/2021 Dr. Jannifer Franklin: Daniel Chavez is a 45 year old left-handed white male with a history of multiple sclerosis.  The patient has been on Ocrevus, he tolerates the medication quite well, he has not had any clinical exacerbations of his multiple sclerosis on the medication.  He reports no new vision changes, numbness or weakness, or balance changes.  The patient is on Provigil for fatigue.  The patient is working full-time.  He has had low IgM levels on Ocrevus, his dosing has been cut back to every 8 months, his next dose is to occur on 26 July 2021.  The patient returns for an evaluation.  REVIEW OF SYSTEMS: Out of a complete 14 system review of symptoms, the patient complains only of the following symptoms, and all other reviewed systems are negative.   See HPI  ALLERGIES: No Known Allergies  HOME MEDICATIONS: Outpatient Medications Prior to Visit  Medication Sig Dispense Refill   acetaminophen (TYLENOL) 325 MG tablet Take 325 mg  by mouth every 6 (six) hours as needed for mild pain or headache.     amLODipine (NORVASC) 10 MG tablet Take 10 mg by mouth daily.     ibuprofen (ADVIL,MOTRIN) 200 MG tablet Take 400-600 mg by mouth every 8 (eight) hours as needed for headache or mild pain (depends on pain if takes 2-3 tablets).     modafinil (PROVIGIL) 200 MG tablet Take 1 tablet (200 mg total) by mouth daily. 90 tablet 1   ocrelizumab (OCREVUS) 300 MG/10ML injection Inject 600 mg into the vein See admin instructions. Inject every 6 months per patient     oxybutynin (DITROPAN-XL) 5 MG 24 hr tablet TAKE ONE TABLET BY MOUTH AT BEDTIME 90 tablet 3   PRESCRIPTION MEDICATION Take 1 tablet by mouth daily. Buproprion, pt unsure of dose. Pharmacy has no record     rosuvastatin (CRESTOR) 20 MG tablet Take 20 mg by mouth at bedtime.     zolpidem (AMBIEN) 10 MG tablet TAKE ONE TABLET BY MOUTH AT BEDTIME AS NEEDED FOR SLEEP (Patient taking differently: Take 10 mg by mouth at bedtime as needed for sleep.) 30 tablet 3   meclizine (ANTIVERT) 25 MG tablet TAKE ONE TABLET BY MOUTH FOUR TIMES A DAY AS NEEDED FOR DIZZINESS 120 tablet 6   No facility-administered medications prior to visit.    PAST MEDICAL HISTORY: Past Medical History:  Diagnosis Date   History of hiatal hernia    MS (multiple sclerosis) ([place])  Multiple sclerosis ([place]) 09/22/2013   Obesity    Optic neuritis, left     PAST SURGICAL HISTORY: Past Surgical History:  Procedure Laterality Date   HERNIA REPAIR     2005/2007 Left siide hiatal hernia   INCISIONAL HERNIA REPAIR N/A 10/20/2017   Procedure: REPAIR OF RECURRENT INCISIONAL HERNIA WITH MESH;  Surgeon: Georganna Skeans, MD;  Location: [facility];  Service: General;  Laterality: N/A;   INSERTION OF MESH N/A 10/20/2017   Procedure: INSERTION OF MESH;  Surgeon: Georganna Skeans, MD;  Location: [facility];  Service: General;  Laterality: N/A;    FAMILY HISTORY: Family History  Problem Relation Age of Onset   Leukemia Sister      SOCIAL HISTORY: Social History   Socioeconomic History   Marital status: Married    Spouse name: Not on file   Number of children: 3   Years of education: hs   Highest education level: Not on file  Occupational History   Not on file  Tobacco Use   Smoking status: Former    Types: Cigarettes    Quit date: 10/15/2008    Years since quitting: 13.1   Smokeless tobacco: Never   Tobacco comments:    quit 03/2006  Vaping Use   Vaping Use: Never used  Substance and Sexual Activity   Alcohol use: No   Drug use: No   Sexual activity: Not on file  Other Topics Concern   Not on file  Social History Narrative   Lives with wife   Caffeine use: Coffee (1-2 cups per day)   Left handed   Social Determinants of Health   Financial Resource Strain: Not on file  Food Insecurity: Not on file  Transportation Needs: Not on file  Physical Activity: Not on file  Stress: Not on file  Social Connections: Not on file  Intimate Partner Violence: Not on file   PHYSICAL EXAM  Vitals:   12/11/21 1032  BP: (!) 145/92  Pulse: 78  Weight: (!) 324 lb (147 kg)  Height: 6' 1"  (1.854 m)   Body mass index is 42.75 kg/m.  Generalized: Well developed, in no acute distress   Neurological examination  Mentation: Alert oriented to time, place, history taking. Follows all commands speech and language fluent Cranial nerve II-XII: Pupils were equal round reactive to light. Extraocular movements were full, visual field were full on confrontational test. Facial sensation and strength were normal. Head turning and shoulder shrug  were normal and symmetric. Motor: The motor testing reveals 5 over 5 strength of all 4 extremities. Good symmetric motor tone is noted throughout.  Sensory: Sensory testing is intact to soft touch on all 4 extremities. No evidence of extinction is noted.  Coordination: Cerebellar testing reveals good finger-nose-finger and heel-to-shin bilaterally.  Gait and station: Gait is  normal. Tandem gait is normal.  Reflexes: Deep tendon reflexes are symmetric and normal bilaterally.   DIAGNOSTIC DATA (LABS, IMAGING, TESTING) - I reviewed patient records, labs, notes, testing and imaging myself where available.  Lab Results  Component Value Date   WBC 5.8 05/02/2021   HGB 15.6 05/02/2021   HCT 47.7 05/02/2021   MCV 91 05/02/2021   PLT 234 05/02/2021      Component Value Date/Time   NA 145 (H) 05/02/2021 0808   K 4.9 05/02/2021 0808   CL 105 05/02/2021 0808   CO2 25 05/02/2021 0808   GLUCOSE 98 05/02/2021 0808   GLUCOSE 127 (H) 09/05/2019 1857   BUN 17 05/02/2021  4327   CREATININE 1.07 05/02/2021 0808   CALCIUM 10.0 05/02/2021 0808   PROT 7.1 05/02/2021 0808   ALBUMIN 4.5 05/02/2021 0808   AST 23 05/02/2021 0808   ALT 41 05/02/2021 0808   ALKPHOS 76 05/02/2021 0808   BILITOT 0.5 05/02/2021 0808   GFRNONAA 75 01/11/2021 1408   GFRAA 87 01/11/2021 1408   No results found for: CHOL, HDL, LDLCALC, LDLDIRECT, TRIG, CHOLHDL No results found for: HGBA1C Lab Results  Component Value Date   VITAMINB12 959 07/30/2017   No results found for: TSH  ASSESSMENT AND PLAN 45 y.o. year old male  has a past medical history of History of hiatal hernia, MS (multiple sclerosis) ([place]), Multiple sclerosis ([place]) (09/22/2013), Obesity, and Optic neuritis, left. here with:  1.  Multiple sclerosis, relapsing remitting -Daniel Chavez is a delightful patient, is stable and doing well on Ocrevus -Check CBC, CMP, IgG, IgA, IgM, Vitamin D level today -Last infusion of Ocrevus was in July 2022, was getting 300 mg every 6 months due to low IgM levels, need to make decision to remain on this interval based on labs, prior was getting 600 mg every 8 months -Check MRI of the brain and cervical spine with and without contrast for routine surveillance -He will follow with Dr. Felecia Shelling now that Dr. Jannifer Franklin is retired in 6 months  2.  Fatigue -Continue modafinil 200 mg daily  3.  Urinary  urgency -Continue oxybutynin  4.  Dizziness -Continue meclizine 25 mg 4 times daily as needed  5.  Insomnia -We will refill Ambien 10 mg PRN for sleep  Daniel Chavez, AGNP-C, DNP 12/11/2021, 11:15 AM [facility] [address], [place], [place] [address] [phone]    I have read the note, and I agree with the clinical assessment and plan.  Richard A. Felecia Shelling, MD, PhD, [facility] Certified in Neurology, Clinical Neurophysiology, Sleep Medicine, Pain Medicine and Neuroimaging  [facility] [address] [place],  [address] [phone]

## 2021-12-12 ENCOUNTER — Encounter: Payer: Self-pay | Admitting: *Deleted

## 2021-12-12 ENCOUNTER — Encounter: Payer: Self-pay | Admitting: Neurology

## 2021-12-12 ENCOUNTER — Telehealth: Payer: Self-pay | Admitting: Neurology

## 2021-12-12 LAB — CBC WITH DIFFERENTIAL/PLATELET
Basophils Absolute: 0.1 10*3/uL (ref 0.0–0.2)
Basos: 1 %
EOS (ABSOLUTE): 0.1 10*3/uL (ref 0.0–0.4)
Eos: 2 %
Hematocrit: 45.9 % (ref 37.5–51.0)
Hemoglobin: 15.2 g/dL (ref 13.0–17.7)
Immature Grans (Abs): 0 10*3/uL (ref 0.0–0.1)
Immature Granulocytes: 0 %
Lymphocytes Absolute: 1.3 10*3/uL (ref 0.7–3.1)
Lymphs: 23 %
MCH: 29.6 pg (ref 26.6–33.0)
MCHC: 33.1 g/dL (ref 31.5–35.7)
MCV: 89 fL (ref 79–97)
Monocytes Absolute: 0.6 10*3/uL (ref 0.1–0.9)
Monocytes: 11 %
Neutrophils Absolute: 3.7 10*3/uL (ref 1.4–7.0)
Neutrophils: 63 %
Platelets: 249 10*3/uL (ref 150–450)
RBC: 5.14 x10E6/uL (ref 4.14–5.80)
RDW: 12.2 % (ref 11.6–15.4)
WBC: 5.8 10*3/uL (ref 3.4–10.8)

## 2021-12-12 LAB — COMPREHENSIVE METABOLIC PANEL
ALT: 46 IU/L — ABNORMAL HIGH (ref 0–44)
AST: 27 IU/L (ref 0–40)
Albumin/Globulin Ratio: 1.7 (ref 1.2–2.2)
Albumin: 4.4 g/dL (ref 4.0–5.0)
Alkaline Phosphatase: 74 IU/L (ref 44–121)
BUN/Creatinine Ratio: 11 (ref 9–20)
BUN: 10 mg/dL (ref 6–24)
Bilirubin Total: 0.4 mg/dL (ref 0.0–1.2)
CO2: 25 mmol/L (ref 20–29)
Calcium: 9.8 mg/dL (ref 8.7–10.2)
Chloride: 103 mmol/L (ref 96–106)
Creatinine, Ser: 0.95 mg/dL (ref 0.76–1.27)
Globulin, Total: 2.6 g/dL (ref 1.5–4.5)
Glucose: 92 mg/dL (ref 70–99)
Potassium: 4.7 mmol/L (ref 3.5–5.2)
Sodium: 144 mmol/L (ref 134–144)
Total Protein: 7 g/dL (ref 6.0–8.5)
eGFR: 99 mL/min/{1.73_m2} (ref >59)

## 2021-12-12 LAB — VITAMIN D 25 HYDROXY (VIT D DEFICIENCY, FRACTURES): Vit D, 25-Hydroxy: 29.8 ng/mL — ABNORMAL LOW (ref 30.0–100.0)

## 2021-12-12 LAB — IGG, IGA, IGM
IgA/Immunoglobulin A, Serum: 391 mg/dL — ABNORMAL HIGH (ref 90–386)
IgG (Immunoglobin G), Serum: 862 mg/dL (ref 603–1613)
IgM (Immunoglobulin M), Srm: 15 mg/dL — ABNORMAL LOW (ref 20–172)

## 2021-12-12 NOTE — Telephone Encounter (Signed)
[name]: [id number] (exp. 12/11/21 to 06/09/22) patient is scheduled at [facility] for 12/19/21.

## 2021-12-18 ENCOUNTER — Telehealth: Payer: Self-pay | Admitting: Neurology

## 2021-12-18 NOTE — Telephone Encounter (Signed)
Labs returned, CBC and CMP are all normal, except very mild elevation in ALT 46.  IgM is still low, at 15. IgG is normal. Discussed with Dr. Felecia Shelling, will continue Ocrevus 300 mg every 6 months. Last was in July, he is now scheduled for March. He needs a sooner intrafusion appointment for around January 2023.   Vitamin D is near normal at 29.8, just make sure taking at least 800 units Vitamin D daily.   There is an appointment scheduled in Feb with Jess, I think this can be cancelled and just let appointment with Dr. Felecia Shelling in March. Thanks.

## 2021-12-18 NOTE — Telephone Encounter (Signed)
I called the patient. He verbalized understanding of the results below.   He will make sure to take the recommended vitamin D supplement.   Aware to expect a phone call from [facility] to move his Orevus to January 2023.   Pending appt w/ Janett Billow in February has been canceled. He will keep his visit with Dr. Felecia Shelling on 6/202/23.

## 2021-12-19 ENCOUNTER — Ambulatory Visit (INDEPENDENT_AMBULATORY_CARE_PROVIDER_SITE_OTHER): Payer: Managed Care, Other (non HMO)

## 2021-12-19 ENCOUNTER — Other Ambulatory Visit: Payer: Self-pay | Admitting: Neurology

## 2021-12-19 DIAGNOSIS — G35 Multiple sclerosis: Secondary | ICD-10-CM

## 2021-12-19 NOTE — Progress Notes (Signed)
MRI tech was unable to get an IV, MRIs were done without contrast.

## 2021-12-20 ENCOUNTER — Telehealth: Payer: Self-pay | Admitting: Neurology

## 2021-12-20 DIAGNOSIS — R42 Dizziness and giddiness: Secondary | ICD-10-CM

## 2021-12-20 NOTE — Addendum Note (Signed)
Addended by: Noberto Retort C on: 12/20/2021 02:46 PM   Modules accepted: Orders

## 2021-12-20 NOTE — Telephone Encounter (Signed)
I spoke to the patient. He verbalized understanding of the MRI results. He is agreeable to try the OTC Flonase. Feels he is chronically congested. If no better, he will seek further workup from ENT. He declined the offer for diazepam and will continue meclizine for now. He would like a referral to vestibular rehab which will be placed today.

## 2021-12-20 NOTE — Telephone Encounter (Signed)
Please call the patient. MRI of the brain was overall stable, no new lesions compared to previous MRI in August 2021.  There was a report of mild mastoid effusions, likely due to mild eustachian tube dysfunction.  He could try nasal spray, like Flonase. Not likely that this is impacting his dizziness, but worth a try with Flonase.   On the issue of dizziness, we had talked about vestibular rehab, not sure if it would be beneficial, but I am happy to place an order and he can try. Also another option other than meclizine is Valium 2 mg up to 3 times daily, on his med list from the past.   MRI cervical spine was overall stable, no new plaques. There are lesions at C3, C4-5, C5-6.   IMPRESSION: This MRI of the cervical spine without contrast shows the following: 1.  T2 hyperintense foci within the spinal cord adjacent to C3, C4-C5 and C5-C6.  These foci were also present on the previous MRI's from 08/09/2020 or 06/23/2018.  There are no new demyelinating plaques. 2.   Mild stable multilevel degenerative changes as detailed above that do not lead to nerve root compression or spinal stenosis.  IMPRESSION: This MRI of the brain without contrast shows the following: 1.   Multiple T2/FLAIR hyperintense foci in the hemispheres, cerebellum and brainstem in a pattern consistent with chronic demyelinating plaque associated with multiple sclerosis.  None of the foci appear to be acute.  Compared to the MRI dated 08/09/2020, there are no new lesions. 2.   Mild mastoid effusions, likely due to mild eustachian tube dysfunction.   3.   Minimal chronic sinusitis 4.   No acute findings.

## 2021-12-26 ENCOUNTER — Encounter: Payer: Self-pay | Admitting: Rehabilitative and Restorative Service Providers"

## 2021-12-26 ENCOUNTER — Other Ambulatory Visit: Payer: Self-pay

## 2021-12-26 ENCOUNTER — Ambulatory Visit
Payer: Managed Care, Other (non HMO) | Attending: Neurology | Admitting: Rehabilitative and Restorative Service Providers"

## 2021-12-26 DIAGNOSIS — R2689 Other abnormalities of gait and mobility: Secondary | ICD-10-CM | POA: Diagnosis not present

## 2021-12-26 DIAGNOSIS — R42 Dizziness and giddiness: Secondary | ICD-10-CM

## 2021-12-26 NOTE — Patient Instructions (Addendum)
° °  Access Code: [email or web address] URL: [email or web address] Date: 12/26/2021 Prepared by: Rudell Cobb  Exercises Standing Gaze Stabilization with Head Rotation - 2-3 x daily - 7 x weekly - 1 sets - 2 reps - 30 seconds hold Standing Gaze Stabilization with Head Nod - 2-3 x daily - 7 x weekly - 1 sets - 2 reps - 30 seconds hold Wide Stance with Eyes Closed on Foam Pad - 2 x daily - 7 x weekly - 1 sets - 3 reps - 30 seconds hold Wide Stance with Head Rotation on Foam Pad - 2 x daily - 7 x weekly - 1 sets - 5-10 reps Wide Stance with Head Nods on Foam Pad - 2 x daily - 7 x weekly - 1 sets - 5-10 reps

## 2021-12-26 NOTE — Therapy (Signed)
[facility] [address], [place], [place], [address] Phone: [phone]   Fax:  [phone]  Physical Therapy Evaluation  Patient Details  Name: Daniel Chavez MRN: [id number] Date of Birth: 27-Jul-1976 Referring Provider (PT): Butler Denmark, NP   Encounter Date: 12/26/2021   PT End of Session - 12/26/21 1233     Visit Number 1    Number of Visits 4    Date for PT Re-Evaluation 02/06/22    Authorization Type cigna    PT Start Time 1150    PT Stop Time 1232    PT Time Calculation (min) 42 min    Activity Tolerance Patient tolerated treatment well             Past Medical History:  Diagnosis Date   History of hiatal hernia    MS (multiple sclerosis) ([place])    Multiple sclerosis ([place]) 09/22/2013   Obesity    Optic neuritis, left     Past Surgical History:  Procedure Laterality Date   HERNIA REPAIR     2005/2007 Left siide hiatal hernia   INCISIONAL HERNIA REPAIR N/A 10/20/2017   Procedure: REPAIR OF RECURRENT INCISIONAL HERNIA WITH MESH;  Surgeon: Georganna Skeans, MD;  Location: [facility];  Service: General;  Laterality: N/A;   INSERTION OF MESH N/A 10/20/2017   Procedure: INSERTION OF MESH;  Surgeon: Georganna Skeans, MD;  Location: [facility];  Service: General;  Laterality: N/A;    There were no vitals filed for this visit.    Subjective Assessment - 12/26/21 1153     Subjective The patient reports no dizziness with laying down it sitting-- only when up on his feet (even after minutes).  He denies sensations of spinning.  He has PMH significant for MS with slight numbness/tingling and mild weakness on the R side. He denies visual changes related to MS but does note some age related changes.    Pertinent History Multiple Sclerosis    Patient Stated Goals Reduce sensation of dizziness when he is on his feet.    Currently in Pain? No/denies                [facility] PT Assessment - 12/26/21 1159       Assessment    Medical Diagnosis dizziness    Referring Provider (PT) Butler Denmark, NP    Onset Date/Surgical Date 12/20/21    Hand Dominance Left    Prior Therapy none      Precautions   Precautions None      Restrictions   Weight Bearing Restrictions No      Balance Screen   Has the patient fallen in the past 6 months No    Has the patient had a decrease in activity level because of a fear of falling?  No    Is the patient reluctant to leave their home because of a fear of falling?  No      Home Environment   Living Environment Private residence    Living Arrangements Spouse/significant other;Children   3 kids   Type of [place] to enter    Entrance Stairs-Number of Steps 2    Home Layout Two level      Prior Function   Level of Independence Independent    Vocation Full time employment    Vocation Requirements inspects dry wall      Observation/Other Assessments   Focus on Therapeutic Outcomes ([place])  n/a      Sensation   Light Touch --   notes mild numbness/tingling in R side     Ambulation/Gait   Ambulation/Gait Yes    Ambulation/Gait Assistance 7: Independent    Ambulation Distance (Feet) 200 Feet    Assistive device None    Gait Pattern Within Functional Limits    Ambulation Surface Level;Indoor    Gait Comments gait with vertical and horizontal head motion slows with some imbalance noted      High Level Balance   High Level Balance Comments Foam standing with eyes closed x 8 seconds before requiring correction; feet together + eyes closed with increased sway on solid surfaces      Functional Gait  Assessment   Gait assessed  Yes    Gait Level Surface Walks 20 ft in less than 5.5 sec, no assistive devices, good speed, no evidence for imbalance, normal gait pattern, deviates no more than 6 in outside of the 12 in walkway width.    Change in Gait Speed Able to smoothly change walking speed without loss of balance or gait deviation. Deviate no more than 6  in outside of the 12 in walkway width.    Gait with Horizontal Head Turns Performs head turns smoothly with slight change in gait velocity (eg, minor disruption to smooth gait path), deviates 6-10 in outside 12 in walkway width, or uses an assistive device.    Gait with Vertical Head Turns Performs task with moderate change in gait velocity, slows down, deviates 10-15 in outside 12 in walkway width but recovers, can continue to walk.    Gait and Pivot Turn Pivot turns safely within 3 sec and stops quickly with no loss of balance.    Step Over Obstacle Is able to step over 2 stacked shoe boxes taped together (9 in total height) without changing gait speed. No evidence of imbalance.    Gait with Narrow Base of Support Is able to ambulate for 10 steps heel to toe with no staggering.    Gait with Eyes Closed Walks 20 ft, no assistive devices, good speed, no evidence of imbalance, normal gait pattern, deviates no more than 6 in outside 12 in walkway width. Ambulates 20 ft in less than 7 sec.    Ambulating Backwards Walks 20 ft, no assistive devices, good speed, no evidence for imbalance, normal gait    Steps Alternating feet, no rail.    Total Score 27    FGA comment: 27/30                    Vestibular Assessment - 12/26/21 1201       Vestibular Assessment   General Observation Patient ambulates into clinic mod indep without any difficulty or deficit noted.      Symptom Behavior   Subjective history of current problem sensation of imbalance when on his feet    Type of Dizziness  Imbalance    Frequency of Dizziness daily    Duration of Dizziness when on his feet    Symptom Nature Intermittent    Aggravating Factors --   when on his feet   Progression of Symptoms Worse    History of similar episodes none      Oculomotor Exam   Oculomotor Alignment Normal    Ocular ROM WNLs    Spontaneous Absent    Gaze-induced  Absent    Smooth Pursuits Intact    Saccades Intact       Vestibulo-Ocular  Reflex   VOR 1 Head Only (x 1 viewing) x 10 reps at slow pace provokes mild sensation of dizziness    Comment head impulse test=positive for low amplitude refixation saccade to target on the right side                Objective measurements completed on examination: See above findings.       [facility] Adult PT Treatment/Exercise - 12/26/21 1159       Neuro Re-ed    Neuro Re-ed Details  foam standing with eyes open/eyes closed and head motion (horizontal and vertical)             Vestibular Treatment/Exercise - 12/26/21 1237       Vestibular Treatment/Exercise   Vestibular Treatment Provided Gaze    Gaze Exercises X1 Viewing Horizontal;X1 Viewing Vertical      X1 Viewing Horizontal   Foot Position standing    Comments 30 seconds with cues on technique      X1 Viewing Vertical   Foot Position standing    Comments 30 seconds with cues on technique                    PT Education - 12/26/21 1229     Education Details HEP    Person(s) Educated Patient    Methods Explanation;Demonstration;Handout    Comprehension Verbalized understanding;Returned demonstration                 PT Long Term Goals - 12/26/21 1234       PT LONG TERM GOAL #1   Title The patient will return demo HEP for multi-sensory balance, head motion with gait, and gaze adaptation.    Time 6    Period Weeks    Target Date 02/06/22      PT LONG TERM GOAL #2   Title The patient will improve foam + eyes closed to 30 seconds to demo imrpoving use of vestibular inputs for balance.    Baseline 8 seconds    Time 6    Period Weeks    Target Date 02/06/22      PT LONG TERM GOAL #3   Title The patient will demonstrate dynamic gait with head turns to mimic job activities without sidestepping or loss of balance.    Time 6    Period Weeks    Target Date 02/06/22                    Plan - 12/26/21 1239     Clinical Impression Statement The patient is a  46 year old male presenting to OP physical therapy with multi-sensory balance and gaze impairment.  He c/o imbalance and dizzy sensation only when he is on his feet.  With clinical testing, he has dizziness with gaze and a + head impulse test.  He has imbalance on foam with eyes closed and when eyes are open with head motion.  He also has sway with gait and head motion (as when scanning a room).  PT to address deficits and progress to patient tolerance.    Personal Factors and Comorbidities Comorbidity 1    Comorbidities multiple sclerosis    Stability/Clinical Decision Making Stable/Uncomplicated    Clinical Decision Making Low    Rehab Potential Good    PT Frequency 1x / week    PT Duration 6 weeks    PT Treatment/Interventions ADLs/Self Care Home Management;Patient/family education;Gait training;Stair training;Functional mobility training;Therapeutic activities;Therapeutic exercise;Balance training;Neuromuscular re-education;Vestibular;Visual/perceptual remediation/compensation  PT Next Visit Plan check HEP and progress adding dynamic gait    PT Home Exercise Plan [email or web address]    Consulted and Agree with Plan of Care Patient             Patient will benefit from skilled therapeutic intervention in order to improve the following deficits and impairments:  Decreased balance, Dizziness  Visit Diagnosis: Other abnormalities of gait and mobility  Dizziness and giddiness     Problem List Patient Active Problem List   Diagnosis Date Noted   Fatigue 07/11/2020   Incarcerated incisional hernia 10/20/2017   Multiple sclerosis ([place]) 09/22/2013    [place], PT 12/26/2021, 12:50 PM  [facility] [address] [place], [place], [address] Phone: [phone]   Fax:  [phone]  Name: Daniel Chavez MRN: [id number] Date of Birth: 07/27/1976

## 2022-01-03 ENCOUNTER — Encounter: Payer: Self-pay | Admitting: Neurology

## 2022-01-03 MED ORDER — MODAFINIL 200 MG PO TABS: 200.0000 mg | ORAL_TABLET | Freq: Every day | ORAL | 1 refills | Status: DC

## 2022-01-09 ENCOUNTER — Ambulatory Visit: Payer: Managed Care, Other (non HMO) | Admitting: Rehabilitative and Restorative Service Providers"

## 2022-01-23 ENCOUNTER — Ambulatory Visit: Payer: Managed Care, Other (non HMO) | Admitting: Adult Health

## 2022-01-30 ENCOUNTER — Telehealth: Payer: Self-pay | Admitting: *Deleted

## 2022-01-30 NOTE — Telephone Encounter (Signed)
PA for modafinil 235m started on covermymeds (key: [id number]). [place][phone]. Decision pending.

## 2022-01-30 NOTE — Telephone Encounter (Signed)
Case [id number] approved through 01/30/2023.

## 2022-02-07 ENCOUNTER — Other Ambulatory Visit: Payer: Self-pay

## 2022-02-07 MED ORDER — OXYBUTYNIN CHLORIDE ER 5 MG PO TB24: 5.0000 mg | ORAL_TABLET | Freq: Every day | ORAL | 3 refills | Status: DC

## 2022-02-21 ENCOUNTER — Encounter: Payer: Self-pay | Admitting: Neurology

## 2022-02-23 ENCOUNTER — Encounter: Payer: Self-pay | Admitting: Neurology

## 2022-02-25 ENCOUNTER — Other Ambulatory Visit: Payer: Self-pay | Admitting: *Deleted

## 2022-02-25 MED ORDER — MECLIZINE HCL 25 MG PO TABS: ORAL_TABLET | ORAL | 3 refills | Status: DC

## 2022-03-06 ENCOUNTER — Encounter: Payer: Self-pay | Admitting: Neurology

## 2022-06-08 ENCOUNTER — Encounter: Payer: Self-pay | Admitting: Neurology

## 2022-06-10 ENCOUNTER — Other Ambulatory Visit: Payer: Self-pay | Admitting: *Deleted

## 2022-06-10 MED ORDER — MODAFINIL 200 MG PO TABS: 200.0000 mg | ORAL_TABLET | Freq: Every day | ORAL | 1 refills | Status: DC

## 2022-06-11 ENCOUNTER — Ambulatory Visit (INDEPENDENT_AMBULATORY_CARE_PROVIDER_SITE_OTHER): Payer: Managed Care, Other (non HMO) | Admitting: Neurology

## 2022-06-11 ENCOUNTER — Encounter: Payer: Self-pay | Admitting: Neurology

## 2022-06-11 VITALS — BP 144/99 | HR 64 | Ht 73.0 in | Wt 314.0 lb

## 2022-06-11 DIAGNOSIS — Z6841 Body Mass Index (BMI) 40.0 and over, adult: Secondary | ICD-10-CM | POA: Diagnosis not present

## 2022-06-11 DIAGNOSIS — R0683 Snoring: Secondary | ICD-10-CM | POA: Diagnosis not present

## 2022-06-11 DIAGNOSIS — G35 Multiple sclerosis: Secondary | ICD-10-CM

## 2022-06-11 DIAGNOSIS — Z79899 Other long term (current) drug therapy: Secondary | ICD-10-CM

## 2022-06-11 DIAGNOSIS — R3915 Urgency of urination: Secondary | ICD-10-CM

## 2022-06-11 DIAGNOSIS — G4719 Other hypersomnia: Secondary | ICD-10-CM

## 2022-06-11 NOTE — Progress Notes (Signed)
[facility]  PATIENT: Daniel Chavez DOB: [birth year]  REFERRING DOCTOR OR PCP: Aura Dials, MD SOURCE: Patient, notes from Dr. Jannifer Franklin and Butler Denmark, NP, imaging and lab reports, MRI images personally reviewed.  _________________________________   HISTORICAL  CHIEF COMPLAINT:  Chief Complaint  Patient presents with   Follow-up    RM 2, alone. Last Ocrevus infusion was 01/23/22 and next is 07/25/22.     HISTORY OF PRESENT ILLNESS:  I had the pleasure of seeing your patient, Daniel Chavez, at the [place] [facility] for neurologic consultation transfer of care his multiple sclerosis  He is a 46 year old man who was diagnosed with MS in 2008 after presenting with optic neuritis.  He is on Ocrevus since 2020 and has been stable over the past couple years..  Currently, he is walking well.   Gait is mildly off due to balance.   He stumbles but has no falls.  He uses the bannister on stairs.   He notes his right hand is weak.   He denies weakness in his legs.    He has numbness and tingling in his right hand from the wrist and down.      He has urinary urgency helped by oxybutynin.    He notes more difficult with near vision.    He denies asymmetry in visual acuity though colors are mildly washed out OS  He has dizziness with lightheadedness when he stands and some spinning when he moves his head.      He notes some fatigue though he accomplishes his tasks.   He sleeps well at night.   He has snoring though wife has not told him he has OSA signs.     He notes some cognitive difficulty.   Focusing is difficult.     Word finding is a problem at times.    He needs to think about his words more.   Mood is doing well.     EPWORTH SLEEPINESS SCALE  On a scale of 0 - 3 what is the chance of dozing:  Sitting and Reading:   2 Watching TV:    3 Sitting inactive in a public place: 0 Passenger in car for one hour: 0 Lying down to rest in the  afternoon: 3 Sitting and talking to someone: 0 Sitting quietly after lunch:  3 In a car, stopped in traffic:  1  Total (out of 24):    12/24     MS History: He was diagnosed in 2008 after presenting with left optic neuritis.   He had an MRI ad it was abnormal though he was diagnosed with just probable MS at the time.   He was started on Copaxone and continued until around 2013.   He stopped due to injection reactions.   Copaxone was represcribed around 2014 but he decided not to take it.  Around 2016, after being off Copaxone x 2 years, he had an exacerbation with reduced gait and slurred speech.   He did Tysabri x 2 - 3 years starting in 2018 but due to an elevated JCV Ab titer, he started Ocrevus in 2020.   Imaging: MRI of the brain 12/19/2021 shows multiple T2/FLAIR hyperintense foci in the periventricular, juxtacortical and deep white matter of both cerebral hemispheres.  In the infratentorial white matter there just a couple punctate foci in the cerebellum and 1 subtle focus in the medulla..  None of the foci appear to be acute.  MRI of the cervical spine 12/19/2021 shows T2 hyperintense focus adjacent to C5-C6 anterior midline.  More subtle small focus to the left at C3-C4 and possibly to the right adjacent to C4-C5.[name]   These foci were present on the 2018 MRI but only the C4-C5 focus was present on the 10/14/2013 MRI  LABS 12/202/22 VIt D = 29.8; IgG=862; IgM=15  REVIEW OF SYSTEMS: Constitutional: No fevers, chills, sweats, or change in appetite Eyes: No visual changes, double vision, eye pain Ear, nose and throat: No hearing loss, ear pain, nasal congestion, sore throat Cardiovascular: No chest pain, palpitations Respiratory:  No shortness of breath at rest or with exertion.   No wheezes GastrointestinaI: No nausea, vomiting, diarrhea, abdominal pain, fecal incontinence Genitourinary:  No dysuria, urinary retention or frequency.  No nocturia. Musculoskeletal:  No neck pain, back  pain Integumentary: No rash, pruritus, skin lesions Neurological: as above Psychiatric: No depression at this time.  No anxiety Endocrine: No palpitations, diaphoresis, change in appetite, change in weigh or increased thirst Hematologic/Lymphatic:  No anemia, purpura, petechiae. Allergic/Immunologic: No itchy/runny eyes, nasal congestion, recent allergic reactions, rashes  ALLERGIES: No Known Allergies  HOME MEDICATIONS:  Current Outpatient Medications:    acetaminophen (TYLENOL) 325 MG tablet, Take 325 mg by mouth every 6 (six) hours as needed for mild pain or headache., Disp: , Rfl:    amLODipine (NORVASC) 10 MG tablet, Take 10 mg by mouth daily., Disp: , Rfl:    ibuprofen (ADVIL,MOTRIN) 200 MG tablet, Take 400-600 mg by mouth every 8 (eight) hours as needed for headache or mild pain (depends on pain if takes 2-3 tablets)., Disp: , Rfl:    meclizine (ANTIVERT) 25 MG tablet, TAKE ONE TABLET BY MOUTH FOUR TIMES A DAY AS NEEDED FOR DIZZINESS, Disp: 120 tablet, Rfl: 3   modafinil (PROVIGIL) 200 MG tablet, Take 1 tablet (200 mg total) by mouth daily., Disp: 90 tablet, Rfl: 1   ocrelizumab (OCREVUS) 300 MG/10ML injection, Inject 600 mg into the vein See admin instructions. Inject every 6 months per patient, Disp: , Rfl:    oxybutynin (DITROPAN-XL) 5 MG 24 hr tablet, Take 1 tablet (5 mg total) by mouth at bedtime., Disp: 90 tablet, Rfl: 3   PRESCRIPTION MEDICATION, Take 1 tablet by mouth daily. Buproprion, pt unsure of dose. Pharmacy has no record, Disp: , Rfl:    rosuvastatin (CRESTOR) 20 MG tablet, Take 20 mg by mouth at bedtime., Disp: , Rfl:    VITAMIN D PO, Take 800 Units by mouth daily., Disp: , Rfl:    zolpidem (AMBIEN) 10 MG tablet, Take 1 tablet (10 mg total) by mouth at bedtime as needed for sleep. for sleep, Disp: 30 tablet, Rfl: 1  PAST MEDICAL HISTORY: Past Medical History:  Diagnosis Date   History of hiatal hernia    MS (multiple sclerosis) ([place])    Multiple sclerosis ([place])  09/22/2013   Obesity    Optic neuritis, left     PAST SURGICAL HISTORY: Past Surgical History:  Procedure Laterality Date   HERNIA REPAIR     2005/2007 Left siide hiatal hernia   INCISIONAL HERNIA REPAIR N/A 10/20/2017   Procedure: REPAIR OF RECURRENT INCISIONAL HERNIA WITH MESH;  Surgeon: Georganna Skeans, MD;  Location: [place];  Service: General;  Laterality: N/A;   INSERTION OF MESH N/A 10/20/2017   Procedure: INSERTION OF MESH;  Surgeon: Georganna Skeans, MD;  Location: [place];  Service: General;  Laterality: N/A;    FAMILY HISTORY: Family History  Problem Relation  Age of Onset   Leukemia Sister     SOCIAL HISTORY:  Social History   Socioeconomic History   Marital status: Married    Spouse name: Not on file   Number of children: 3   Years of education: hs   Highest education level: Not on file  Occupational History   Not on file  Tobacco Use   Smoking status: Former    Types: Cigarettes    Quit date: 10/15/2008    Years since quitting: 13.6   Smokeless tobacco: Never   Tobacco comments:    quit 03/2006  Vaping Use   Vaping Use: Never used  Substance and Sexual Activity   Alcohol use: No   Drug use: No   Sexual activity: Not on file  Other Topics Concern   Not on file  Social History Narrative   Lives with wife   Caffeine use: Coffee (1-2 cups per day)   Left handed   Social Determinants of Health   Financial Resource Strain: Not on file  Food Insecurity: Not on file  Transportation Needs: Not on file  Physical Activity: Not on file  Stress: Not on file  Social Connections: Not on file  Intimate Partner Violence: Not on file     PHYSICAL EXAM  Vitals:   06/11/22 0801  BP: (!) 144/99  Pulse: 64  Weight: (!) 314 lb (142.4 kg)  Height: 6' 1"  (1.854 m)    Body mass index is 41.43 kg/m.   General: The patient is well-developed and well-nourished and in no acute distress  HEENT:  Head is [place]/AT.  Sclera are anicteric.  Funduscopic exam shows  normal optic discs and retinal vessels.   Pharynx is Mallmapti 2    He has cerumen occluding the right ear canal and partially occluding the left.     Left TMI.    Neck: No carotid bruits are noted.  The neck is nontender.  Cardiovascular: The heart has a regular rate and rhythm with a normal S1 and S2. There were no murmurs, gallops or rubs.    Skin: Extremities are without rash or  edema.  Musculoskeletal:  Back is nontender  Neurologic Exam  Mental status: The patient is alert and oriented x 3 at the time of the examination. The patient has apparent normal recent and remote memory, with an apparently normal attention span and concentration ability.   Speech is normal.  Cranial nerves: Extraocular movements are full. Pupils are equal, round, and reactive to light and accomodation.  Mildly reduced color visio OS. .  Facial symmetry is present. There is good facial sensation to soft touch bilaterally.Facial strength is normal.  Trapezius and sternocleidomastoid strength is normal. No dysarthria is noted.  The tongue is midline, and the patient has symmetric elevation of the soft palate. No obvious hearing deficits are noted.   However Weber lateralized left.  Motor:  Muscle bulk is normal.   Tone is normal. Strength is  5 / 5 in all 4 extremities.   Sensory: Sensory testing is intact to pinprick, soft touch and vibration sensation in all 4 extremities.  Coordination: Cerebellar testing reveals good finger-nose-finger and heel-to-shin bilaterally.  Gait and station: Station is normal.   Gait is normal. Tandem gait is mildly wide. Romberg is negative.   Reflexes: Deep tendon reflexes are symmetric and normal bilaterally.   Plantar responses are flexor.    DIAGNOSTIC DATA (LABS, IMAGING, TESTING) - I reviewed patient records, labs, notes, testing and imaging myself where  available.  Lab Results  Component Value Date   WBC 5.8 12/11/2021   HGB 15.2 12/11/2021   HCT 45.9 12/11/2021    MCV 89 12/11/2021   PLT 249 12/11/2021      Component Value Date/Time   NA 144 12/11/2021 1111   K 4.7 12/11/2021 1111   CL 103 12/11/2021 1111   CO2 25 12/11/2021 1111   GLUCOSE 92 12/11/2021 1111   GLUCOSE 127 (H) 09/05/2019 1857   BUN 10 12/11/2021 1111   CREATININE 0.95 12/11/2021 1111   CALCIUM 9.8 12/11/2021 1111   PROT 7.0 12/11/2021 1111   ALBUMIN 4.4 12/11/2021 1111   AST 27 12/11/2021 1111   ALT 46 (H) 12/11/2021 1111   ALKPHOS 74 12/11/2021 1111   BILITOT 0.4 12/11/2021 1111   GFRNONAA 75 01/11/2021 1408   GFRAA 87 01/11/2021 1408   No results found for: "CHOL", "HDL", "LDLCALC", "LDLDIRECT", "TRIG", "CHOLHDL" No results found for: "HGBA1C" Lab Results  Component Value Date   VITAMINB12 959 07/30/2017   No results found for: "TSH"     ASSESSMENT AND PLAN  Multiple sclerosis ([place]) - Plan: IgG, IgA, IgM, CBC with Differential/Platelet  BMI 40.0-44.9, adult ([place])  Urinary urgency  Snoring - Plan: Home sleep test  Excessive daytime sleepiness - Plan: Home sleep test  High risk medication use - Plan: IgG, IgA, IgM, CBC with Differential/Platelet   Mr. Mccravy will continue Ocrevus.  We will check some lab work including IgG/IgM and CBC with differential today. He has excessive daytime sleepiness and snoring.  I am concerned about the possibility of obstructive sleep apnea and we will check a home sleep study. He has had difficulties with vertigo.  Recent MRI did show a right mastoid effusion consistent with eustachian tube dysfunction.  Additionally, on exam the ear canal on the right was occluded by wax and partially occluded on the left.  Therefore, I have advised him to get wax drops to try to clear out the ears and to use Flonase for eustachian tube dysfunction. He will return to see [place] in 6 months or sooner if there are new or worsening neurologic symptoms.  48-minute office visit with the majority of the time spent face-to-face for history and  physical, discussion/counseling and decision-making.  Additional time with record review and documentation.   Phoenix Riesen A. Felecia Shelling, MD, [facility] 2/95/6213, 08:65 PM Certified in Neurology, Clinical Neurophysiology, Sleep Medicine and Neuroimaging  [facility] [address] [place], [place] [address] [phone]

## 2022-06-12 LAB — CBC WITH DIFFERENTIAL/PLATELET
Basophils Absolute: 0.1 10*3/uL (ref 0.0–0.2)
Basos: 1 %
EOS (ABSOLUTE): 0.1 10*3/uL (ref 0.0–0.4)
Eos: 2 %
Hematocrit: 45.3 % (ref 37.5–51.0)
Hemoglobin: 15.4 g/dL (ref 13.0–17.7)
Immature Grans (Abs): 0 10*3/uL (ref 0.0–0.1)
Immature Granulocytes: 0 %
Lymphocytes Absolute: 1.2 10*3/uL (ref 0.7–3.1)
Lymphs: 18 %
MCH: 30.6 pg (ref 26.6–33.0)
MCHC: 34 g/dL (ref 31.5–35.7)
MCV: 90 fL (ref 79–97)
Monocytes Absolute: 0.6 10*3/uL (ref 0.1–0.9)
Monocytes: 10 %
Neutrophils Absolute: 4.5 10*3/uL (ref 1.4–7.0)
Neutrophils: 69 %
Platelets: 243 10*3/uL (ref 150–450)
RBC: 5.04 x10E6/uL (ref 4.14–5.80)
RDW: 12.8 % (ref 11.6–15.4)
WBC: 6.5 10*3/uL (ref 3.4–10.8)

## 2022-06-12 LAB — IGG, IGA, IGM
IgA/Immunoglobulin A, Serum: 394 mg/dL — ABNORMAL HIGH (ref 90–386)
IgG (Immunoglobin G), Serum: 803 mg/dL (ref 603–1613)
IgM (Immunoglobulin M), Srm: 10 mg/dL — ABNORMAL LOW (ref 20–172)

## 2022-06-13 ENCOUNTER — Telehealth: Payer: Self-pay | Admitting: Neurology

## 2022-06-13 NOTE — Telephone Encounter (Signed)
HST- [place] no auth req spoke to [place] [facility] ref # [id number]. Patient is scheduled at [facility] for 07/09/22 at 10 AM.

## 2022-06-26 ENCOUNTER — Encounter: Payer: Self-pay | Admitting: *Deleted

## 2022-06-27 NOTE — Telephone Encounter (Signed)
Faxed updated prescriber service form back to [place] at [phone]. Received fax confirmation.

## 2022-10-14 ENCOUNTER — Encounter: Payer: Self-pay | Admitting: Neurology

## 2022-10-14 ENCOUNTER — Other Ambulatory Visit: Payer: Self-pay | Admitting: *Deleted

## 2022-10-14 MED ORDER — MODAFINIL 200 MG PO TABS: 200.0000 mg | ORAL_TABLET | Freq: Every day | ORAL | 0 refills | Status: DC

## 2022-11-02 ENCOUNTER — Other Ambulatory Visit: Payer: Self-pay | Admitting: Neurology

## 2022-12-09 NOTE — Progress Notes (Unsigned)
Patient: Daniel Chavez Date of Birth: [birth year]  Reason for Visit: Follow up History from: Patient, wife  Primary Neurologist: [facility]   ASSESSMENT AND PLAN 46 y.o. year old male   1.  Multiple sclerosis -Continue Ocrevus 300 mg every 6 months -Check CBC, IgM IgA IgG -Order MRI of the brain and cervical spine to check for subclinical progression  2.  Snoring 3.  Daytime drowsiness -HST ordered last visit, will follow up with sleep lab about this -Continue Provigil 200 mg daily, I send refill to hold until 12/30/22  4.  Dizziness -Continue meclizine -Use Flonase for eustachian tube dysfunction, earwax drops for right ear -Follow-up in 6 months with Dr. Epimenio Foot   HISTORY OF PRESENT ILLNESS: Today 12/11/22 Here today for follow-up. Remains on Ocrevus 300 mg every 6 months. Last infusion was around August/Sept 2023. Vision is stable, trouble with close up vision, uses readers. Uses meclizine 3 times daily, it helps, when standing, turning head. He likes the prescription version. Doesn't use Flonase consistently. Didn't get HST. Still has daytime fatigue, snores depending on level of fatigue. On Provigil 200 mg once daily, very helpful. No falls. No new weakness to arm or legs. Takes oxybutynin, occasional urgency. Labs 06/11/22 showed IgM 10, IgG 803.  CBC was normal. Today, ESS 8. Interested in MRI since end of the year.   HISTORY  Dr. Epimenio Foot 06/11/22: He is a 46 year old man who was diagnosed with MS in 2008 after presenting with optic neuritis.  He is on Ocrevus since 2020 and has been stable over the past couple years..   Currently, he is walking well.   Gait is mildly off due to balance.   He stumbles but has no falls.  He uses the bannister on stairs.   He notes his right hand is weak.   He denies weakness in his legs.    He has numbness and tingling in his right hand from the wrist and down.      He has urinary urgency helped by oxybutynin.    He notes more difficult with near  vision.    He denies asymmetry in visual acuity though colors are mildly washed out OS   He has dizziness with lightheadedness when he stands and some spinning when he moves his head.       He notes some fatigue though he accomplishes his tasks.   He sleeps well at night.   He has snoring though wife has not told him he has OSA signs.     He notes some cognitive difficulty.   Focusing is difficult.     Word finding is a problem at times.    He needs to think about his words more.   Mood is doing well.       EPWORTH SLEEPINESS SCALE   On a scale of 0 - 3 what is the chance of dozing:   Sitting and Reading:                           2 Watching TV:                                      3 Sitting inactive in a public place:        0 Passenger in car for one hour:           0  Lying down to rest in the afternoon:   3 Sitting and talking to someone:          0 Sitting quietly after lunch:                   3 In a car, stopped in traffic:                  1   Total (out of 24):    12/24    MS History: He was diagnosed in 2008 after presenting with left optic neuritis.   He had an MRI ad it was abnormal though he was diagnosed with just probable MS at the time.   He was started on Copaxone and continued until around 2013.   He stopped due to injection reactions.   Copaxone was represcribed around 2014 but he decided not to take it.  Around 2016, after being off Copaxone x 2 years, he had an exacerbation with reduced gait and slurred speech.   He did Tysabri x 2 - 3 years starting in 2018 but due to an elevated JCV Ab titer, he started Ocrevus in 2020.     Imaging: MRI of the brain 12/19/2021 shows multiple T2/FLAIR hyperintense foci in the periventricular, juxtacortical and deep white matter of both cerebral hemispheres.  In the infratentorial white matter there just a couple punctate foci in the cerebellum and 1 subtle focus in the medulla..  None of the foci appear to be acute.     MRI of the  cervical spine 12/19/2021 shows T2 hyperintense focus adjacent to C5-C6 anterior midline.  More subtle small focus to the left at C3-C4 and possibly to the right adjacent to C4-C5.[name]   These foci were present on the 2018 MRI but only the C4-C5 focus was present on the 10/14/2013 MRI   LABS 12/202/22 VIt D = 29.8; IgG=862; IgM=15  REVIEW OF SYSTEMS: Out of a complete 14 system review of symptoms, the patient complains only of the following symptoms, and all other reviewed systems are negative.  See HPI  ALLERGIES: No Known Allergies  HOME MEDICATIONS: Outpatient Medications Prior to Visit  Medication Sig Dispense Refill   acetaminophen (TYLENOL) 325 MG tablet Take 325 mg by mouth every 6 (six) hours as needed for mild pain or headache.     amLODipine (NORVASC) 10 MG tablet Take 10 mg by mouth daily.     ibuprofen (ADVIL,MOTRIN) 200 MG tablet Take 400-600 mg by mouth every 8 (eight) hours as needed for headache or mild pain (depends on pain if takes 2-3 tablets).     meclizine (ANTIVERT) 25 MG tablet TAKE 1 TABLET BY MOUTH FOUR TIMES DAILY AS NEEDED FOR DIZZINESS 120 tablet 3   modafinil (PROVIGIL) 200 MG tablet Take 1 tablet (200 mg total) by mouth daily. 90 tablet 1   modafinil (PROVIGIL) 200 MG tablet Take 1 tablet (200 mg total) by mouth daily. 7 tablet 0   ocrelizumab (OCREVUS) 300 MG/10ML injection Inject 600 mg into the vein See admin instructions. Inject every 6 months per patient     oxybutynin (DITROPAN-XL) 5 MG 24 hr tablet Take 1 tablet (5 mg total) by mouth at bedtime. 90 tablet 3   PRESCRIPTION MEDICATION Take 1 tablet by mouth daily. Buproprion, pt unsure of dose. Pharmacy has no record     rosuvastatin (CRESTOR) 20 MG tablet Take 20 mg by mouth at bedtime.     VITAMIN D PO Take 800 Units by mouth  daily.     zolpidem (AMBIEN) 10 MG tablet Take 1 tablet (10 mg total) by mouth at bedtime as needed for sleep. for sleep 30 tablet 1   No facility-administered medications prior to  visit.    PAST MEDICAL HISTORY: Past Medical History:  Diagnosis Date   History of hiatal hernia    MS (multiple sclerosis) ([facility])    Multiple sclerosis ([facility]) 09/22/2013   Obesity    Optic neuritis, left     PAST SURGICAL HISTORY: Past Surgical History:  Procedure Laterality Date   HERNIA REPAIR     2005/2007 Left siide hiatal hernia   INCISIONAL HERNIA REPAIR N/A 10/20/2017   Procedure: REPAIR OF RECURRENT INCISIONAL HERNIA WITH MESH;  Surgeon: Violeta Gelinas, MD;  Location: [facility];  Service: General;  Laterality: N/A;   INSERTION OF MESH N/A 10/20/2017   Procedure: INSERTION OF MESH;  Surgeon: Violeta Gelinas, MD;  Location: [facility] OR;  Service: General;  Laterality: N/A;    FAMILY HISTORY: Family History  Problem Relation Age of Onset   Leukemia Sister     SOCIAL HISTORY: Social History   Socioeconomic History   Marital status: Married    Spouse name: Not on file   Number of children: 3   Years of education: hs   Highest education level: Not on file  Occupational History   Not on file  Tobacco Use   Smoking status: Former    Types: Cigarettes    Quit date: 10/15/2008    Years since quitting: 14.1   Smokeless tobacco: Never   Tobacco comments:    quit 03/2006  Vaping Use   Vaping Use: Never used  Substance and Sexual Activity   Alcohol use: No   Drug use: No   Sexual activity: Not on file  Other Topics Concern   Not on file  Social History Narrative   Lives with wife   Caffeine use: Coffee (1-2 cups per day)   Left handed   Social Determinants of Health   Financial Resource Strain: Not on file  Food Insecurity: Not on file  Transportation Needs: Not on file  Physical Activity: Not on file  Stress: Not on file  Social Connections: Not on file  Intimate Partner Violence: Not on file   PHYSICAL EXAM  Vitals:   12/11/22 0826  BP: (!) 152/76  Pulse: 73  Weight: (!) 325 lb (147.4 kg)  Height: 6\' 1"  (1.854 m)   Body mass index is 42.88  kg/m.  Generalized: Well developed, in no acute distress  Neurological examination  Mentation: Alert oriented to time, place, history taking. Follows all commands speech and language fluent Cranial nerve II-XII: Pupils were equal round reactive to light. Extraocular movements were full, visual field were full on confrontational test. Facial sensation and strength were normal. Head turning and shoulder shrug  were normal and symmetric. Significant ear wax buildup to right ear, mild to left Motor: The motor testing reveals 5 over 5 strength of all 4 extremities. Good symmetric motor tone is noted throughout.  Sensory: Sensory testing is intact to soft touch on all 4 extremities. No evidence of extinction is noted.  Coordination: Cerebellar testing reveals good finger-nose-finger and heel-to-shin bilaterally.  Gait and station: Gait is normal. Tandem gait is normal.  Reflexes: Deep tendon reflexes are symmetric and normal bilaterally.   DIAGNOSTIC DATA (LABS, IMAGING, TESTING) - I reviewed patient records, labs, notes, testing and imaging myself where available.  Lab Results  Component Value Date  WBC 6.5 06/11/2022   HGB 15.4 06/11/2022   HCT 45.3 06/11/2022   MCV 90 06/11/2022   PLT 243 06/11/2022      Component Value Date/Time   NA 144 12/11/2021 1111   K 4.7 12/11/2021 1111   CL 103 12/11/2021 1111   CO2 25 12/11/2021 1111   GLUCOSE 92 12/11/2021 1111   GLUCOSE 127 (H) 09/05/2019 1857   BUN 10 12/11/2021 1111   CREATININE 0.95 12/11/2021 1111   CALCIUM 9.8 12/11/2021 1111   PROT 7.0 12/11/2021 1111   ALBUMIN 4.4 12/11/2021 1111   AST 27 12/11/2021 1111   ALT 46 (H) 12/11/2021 1111   ALKPHOS 74 12/11/2021 1111   BILITOT 0.4 12/11/2021 1111   GFRNONAA 75 01/11/2021 1408   GFRAA 87 01/11/2021 1408   No results found for: "CHOL", "HDL", "LDLCALC", "LDLDIRECT", "TRIG", "CHOLHDL" No results found for: "HGBA1C" Lab Results  Component Value Date   VITAMINB12 959 07/30/2017    No results found for: "TSH"  Margie Ege, Edrick Oh, DNP 12/11/2022, 8:38 AM [facility] [address] [place], [place] [address] [phone]

## 2022-12-11 ENCOUNTER — Telehealth: Payer: Self-pay | Admitting: Neurology

## 2022-12-11 ENCOUNTER — Encounter: Payer: Self-pay | Admitting: Neurology

## 2022-12-11 ENCOUNTER — Ambulatory Visit (INDEPENDENT_AMBULATORY_CARE_PROVIDER_SITE_OTHER): Payer: Managed Care, Other (non HMO) | Admitting: Neurology

## 2022-12-11 VITALS — BP 152/76 | HR 73 | Ht 73.0 in | Wt 325.0 lb

## 2022-12-11 DIAGNOSIS — G35 Multiple sclerosis: Secondary | ICD-10-CM | POA: Diagnosis not present

## 2022-12-11 DIAGNOSIS — R5383 Other fatigue: Secondary | ICD-10-CM

## 2022-12-11 MED ORDER — MODAFINIL 200 MG PO TABS: 200.0000 mg | ORAL_TABLET | Freq: Every day | ORAL | 1 refills | Status: DC

## 2022-12-11 MED ORDER — MECLIZINE HCL 25 MG PO TABS: ORAL_TABLET | ORAL | 3 refills | Status: DC

## 2022-12-11 MED ORDER — OXYBUTYNIN CHLORIDE ER 5 MG PO TB24: 5.0000 mg | ORAL_TABLET | Freq: Every day | ORAL | 3 refills | Status: DC

## 2022-12-11 NOTE — Patient Instructions (Signed)
Check labs, order MRI imaging  See you back in 6 months

## 2022-12-11 NOTE — Telephone Encounter (Signed)
[employer] sent to [facility] they obtain auth [id number] 

## 2022-12-11 NOTE — Telephone Encounter (Signed)
Can you check to see if HST is still active? Patient would like to pursue. Dr. Epimenio Foot ordered at last visit .

## 2022-12-12 LAB — CBC WITH DIFFERENTIAL/PLATELET
Basophils Absolute: 0 10*3/uL (ref 0.0–0.2)
Basos: 1 %
EOS (ABSOLUTE): 0.2 10*3/uL (ref 0.0–0.4)
Eos: 2 %
Hematocrit: 43.8 % (ref 37.5–51.0)
Hemoglobin: 14.9 g/dL (ref 13.0–17.7)
Immature Grans (Abs): 0 10*3/uL (ref 0.0–0.1)
Immature Granulocytes: 0 %
Lymphocytes Absolute: 1.4 10*3/uL (ref 0.7–3.1)
Lymphs: 16 %
MCH: 30.5 pg (ref 26.6–33.0)
MCHC: 34 g/dL (ref 31.5–35.7)
MCV: 90 fL (ref 79–97)
Monocytes Absolute: 0.7 10*3/uL (ref 0.1–0.9)
Monocytes: 9 %
Neutrophils Absolute: 6.3 10*3/uL (ref 1.4–7.0)
Neutrophils: 72 %
Platelets: 245 10*3/uL (ref 150–450)
RBC: 4.89 x10E6/uL (ref 4.14–5.80)
RDW: 12.4 % (ref 11.6–15.4)
WBC: 8.6 10*3/uL (ref 3.4–10.8)

## 2022-12-12 LAB — IGG, IGA, IGM
IgA/Immunoglobulin A, Serum: 377 mg/dL (ref 90–386)
IgG (Immunoglobin G), Serum: 802 mg/dL (ref 603–1613)
IgM (Immunoglobulin M), Srm: 10 mg/dL — ABNORMAL LOW (ref 20–172)

## 2022-12-19 ENCOUNTER — Other Ambulatory Visit: Payer: Managed Care, Other (non HMO)

## 2023-01-09 NOTE — Telephone Encounter (Signed)
HST- [employer] no auth req via auto machine ref # [id number]   Patient is scheduled at [facility] for 01/28/23 at 4 pm.  Mailed packet & mychart message the patient.

## 2023-01-14 ENCOUNTER — Encounter: Payer: Self-pay | Admitting: Neurology

## 2023-01-15 ENCOUNTER — Other Ambulatory Visit: Payer: Self-pay | Admitting: Neurology

## 2023-01-15 ENCOUNTER — Encounter: Payer: Self-pay | Admitting: Neurology

## 2023-01-15 MED ORDER — OSELTAMIVIR PHOSPHATE 75 MG PO CAPS: 75.0000 mg | ORAL_CAPSULE | Freq: Two times a day (BID) | ORAL | 0 refills | Status: AC

## 2023-01-15 NOTE — Telephone Encounter (Signed)
Called [facility] at [phone]. Spoke w/ tech and they cx rx.

## 2023-01-20 ENCOUNTER — Encounter: Payer: Self-pay | Admitting: Neurology

## 2023-01-27 NOTE — Telephone Encounter (Signed)
Patient is r/s for 02/25/23.

## 2023-02-25 ENCOUNTER — Ambulatory Visit: Payer: Managed Care, Other (non HMO) | Admitting: Neurology

## 2023-02-25 DIAGNOSIS — G4719 Other hypersomnia: Secondary | ICD-10-CM

## 2023-02-25 DIAGNOSIS — G4710 Hypersomnia, unspecified: Secondary | ICD-10-CM | POA: Diagnosis not present

## 2023-02-25 DIAGNOSIS — R0683 Snoring: Secondary | ICD-10-CM

## 2023-02-26 NOTE — Progress Notes (Signed)
   [facility]  HOME SLEEP STUDY  STUDY DATE: 02/25/2023 PATIENT NAME: ELAI FARRER DOB: 18-Aug-1976 MRN: [id number]  ORDERING CLINICIAN: Richard A. Felecia Shelling, MD, PhD INTERPRETING CLINICIAN: Richard A. Sater, MD. PhD   CLINICAL INFORMATION: 47 year old man with multiple sclerosis, snoring and excessive daytime sleepiness    IMPRESSION:  Mild to moderate obstructive sleep apnea with pAHI 3% of 15.5/h and ODI 4% of 9.1/h No nocturnal hypoxemia was noted.   RECOMMENDATION: As he also has excessive daytime sleepiness, I recommend AutoPap 5 to 15 cm H2O pressure with heated humidifier.  Download in 30 to 90 days. Alternatively, weight loss and an oral appliance could be considered if he prefers not to do CPAP Follow-up with Dr. Felecia Shelling   INTERPRETING PHYSICIAN:   Nanine Means. Felecia Shelling, MD, PhD, [facility] Certified in Neurology, Clinical Neurophysiology, Sleep Medicine, Pain Medicine and Neuroimaging  [facility] [address], [place], [place] [address] [phone]

## 2023-02-27 ENCOUNTER — Telehealth: Payer: Self-pay | Admitting: *Deleted

## 2023-02-27 DIAGNOSIS — G4733 Obstructive sleep apnea (adult) (pediatric): Secondary | ICD-10-CM

## 2023-02-27 NOTE — Telephone Encounter (Signed)
Called pt and scheduled him for 6/3 with Dr. Felecia Shelling. I cx all other appointments.

## 2023-02-27 NOTE — Telephone Encounter (Signed)
Please call pt back. 06/17/23 is too far out. Needs to be before 06/11/2023. Can you please offer 05/26/23 at 2pm with Dr. Felecia Shelling?  If he accepts, all other appt can be cx.

## 2023-02-27 NOTE — Telephone Encounter (Signed)
Noted  

## 2023-02-27 NOTE — Telephone Encounter (Signed)
Called pt and scheduled initial CPAP appointment with Judson Roch on 6/25 @ 12:15 pm. Informed pt to please bring machine and power cord to this appointment. Pt said ok and thank me for calling.

## 2023-02-27 NOTE — Telephone Encounter (Signed)
-----   Message from Britt Bottom, MD sent at 02/26/2023  6:01 PM EST ----- Please let him know that the study showed mild to moderate obstructive sleep apnea.  I recommend AutoPap 5 to 15 cm H2O with heated humidifier.  Alternatively, weight loss with an oral appliance could be considered.

## 2023-02-27 NOTE — Telephone Encounter (Signed)
Patient aware of sleep study results, new cpap start order/notes faxed to [facility]. Pt aware once insurance approval the DME will reach out to him.    Phone room please call patient to schedule his initial cpap follow up end of May beginning of June.

## 2023-04-06 ENCOUNTER — Encounter: Payer: Self-pay | Admitting: Neurology

## 2023-04-07 ENCOUNTER — Telehealth: Payer: Self-pay

## 2023-04-07 ENCOUNTER — Other Ambulatory Visit: Payer: Self-pay

## 2023-04-07 MED ORDER — MODAFINIL 200 MG PO TABS: 200.0000 mg | ORAL_TABLET | Freq: Every day | ORAL | 1 refills | Status: DC

## 2023-04-07 NOTE — Telephone Encounter (Signed)
PA needed for modafinil

## 2023-04-07 NOTE — Telephone Encounter (Signed)
This encounter was created in error - please disregard.

## 2023-04-09 ENCOUNTER — Other Ambulatory Visit (HOSPITAL_COMMUNITY): Payer: Self-pay

## 2023-04-09 NOTE — Telephone Encounter (Signed)
Pharmacy Patient Advocate Encounter  Prior Authorization for Modafanil 200MG  Tablets has been approved by [employer] (ins).    PA # Case ID: [id number] Effective dates: 04/09/2023 through 04/08/2024

## 2023-04-09 NOTE — Telephone Encounter (Signed)
Pharmacy Patient Advocate Encounter   Received notification from [employer] that prior authorization for Modafinil  Tablets is required/requested. PA submitted on 04/09/2023 to (ins) [employer] via CoverMyMeds Key or (Medicaid) confirmation # [id number] Status is pending  Got a message that I needed to call [employer]-unable to answer clinical questions via CMM-Called [employer]-was on hold for 20 minutes-left callback info on the automated line for [employer] to call me back.

## 2023-04-10 NOTE — Telephone Encounter (Signed)
Called pt . Informed him Modafanil 200MG  Tablets was approved through insurance and he should be able to pick from pharmacy. Pt said thank you for calling.

## 2023-04-10 NOTE — Telephone Encounter (Signed)
PA has been approved

## 2023-04-22 ENCOUNTER — Other Ambulatory Visit (HOSPITAL_COMMUNITY): Payer: Self-pay

## 2023-05-26 ENCOUNTER — Ambulatory Visit (INDEPENDENT_AMBULATORY_CARE_PROVIDER_SITE_OTHER): Payer: Managed Care, Other (non HMO) | Admitting: Neurology

## 2023-05-26 ENCOUNTER — Telehealth: Payer: Self-pay | Admitting: Neurology

## 2023-05-26 ENCOUNTER — Encounter: Payer: Self-pay | Admitting: Neurology

## 2023-05-26 VITALS — BP 107/73 | HR 73 | Ht 73.0 in | Wt 292.5 lb

## 2023-05-26 DIAGNOSIS — G4733 Obstructive sleep apnea (adult) (pediatric): Secondary | ICD-10-CM | POA: Diagnosis not present

## 2023-05-26 DIAGNOSIS — R5383 Other fatigue: Secondary | ICD-10-CM

## 2023-05-26 DIAGNOSIS — G35 Multiple sclerosis: Secondary | ICD-10-CM

## 2023-05-26 DIAGNOSIS — R3915 Urgency of urination: Secondary | ICD-10-CM | POA: Insufficient documentation

## 2023-05-26 DIAGNOSIS — E559 Vitamin D deficiency, unspecified: Secondary | ICD-10-CM

## 2023-05-26 DIAGNOSIS — Z79899 Other long term (current) drug therapy: Secondary | ICD-10-CM

## 2023-05-26 NOTE — Progress Notes (Signed)
[facility]  PATIENT: Daniel Chavez DOB: 04-27-1976  REFERRING DOCTOR OR PCP: Henrine Screws, MD SOURCE: Patient, notes from Dr. Anne Hahn and Margie Ege, NP, imaging and lab reports, MRI images personally reviewed.  _________________________________   HISTORICAL  CHIEF COMPLAINT:  Chief Complaint  Patient presents with   Room 10    Pt is here Alone. Pt states that things haven't been good lately with his CPAP Machine. Pt states that he is having a hard time keeping it on all night. Pt states that he has tried to loosen up his mask and then air came out. Pt is looking for a solution so that he can wear his CPAP all night.    HISTORY OF PRESENT ILLNESS:   Daniel Chavez ia a 47 y.o. man with relapsing remitting multiple sclerosis  He was diagnosed with MS in 2008 after presenting with optic neuritis.  He is on Ocrevus since 2020 and has been stable over the past couple years..   Last infusion was 02/03/2023  Currently, he is walking well.   Gait is mildly off due to balance.   He stumbles but has no falls.  He uses the bannister on stairs.   Denies much weakness or spasticity.     He has occasional numbness and tingling in his right hand from the wrist and down.  He has urinary urgency helped by oxybutynin.      He notes more difficult with near vision.    He denies asymmetry in visual acuity though colors are mildly washed out OS  He has dizziness with lightheadedness when he stands and some spinning when he moves his head.      He notes some fatigue but feels less sleepy since starting CPAP.[name]   He sleeps well at night.    He notes some cognitive difficulty.   Focusing is difficult.     Word finding is a problem at times.    He needs to think about his words more.   Mood is doing well.    He is on CPAP auto-pap 5-15 cm.    HST showed moderate OSA.    Download shows only 27% 4 hour use though he uses it at last 1 hour every night.   He takes the mask off without  even knowing it (or it falls off).   He does not find the mask uncomfortable.   Efficacy is good with AHI 2.6/h when he wears it.   He uses a FFM that is comfortable and snug.      He does not wake up to use the bathroom.    If he wears I all night or alt elast 4 hours, he feels better the next day.   He is less sleepy since starting CPAP.     He is trying to lose weight with diet and has lost a few pounds   EPWORTH SLEEPINESS SCALE  On a scale of 0 - 3 what is the chance of dozing:  Sitting and Reading:   1 Watching TV:    0 Sitting inactive in a public place: 0 Passenger in car for one hour: 0 Lying down to rest in the afternoon: 2 Sitting and talking to someone: 0 Sitting quietly after lunch:  2 In a car, stopped in traffic:  0  Total (out of 24):    5/24     MS History: He was diagnosed in 2008 after presenting with left optic neuritis.   He had an  MRI ad it was abnormal though he was diagnosed with just probable MS at the time.   He was started on Copaxone and continued until around 2013.   He stopped due to injection reactions.   Copaxone was represcribed around 2014 but he decided not to take it.  Around 2016, after being off Copaxone x 2 years, he had an exacerbation with reduced gait and slurred speech.   He did Tysabri x 2 - 3 years starting in 2018 but due to an elevated JCV Ab titer, he started Ocrevus in 2020.   Imaging: MRI of the brain 12/19/2021 shows multiple T2/FLAIR hyperintense foci in the periventricular, juxtacortical and deep white matter of both cerebral hemispheres.  In the infratentorial white matter there just a couple punctate foci in the cerebellum and 1 subtle focus in the medulla..  None of the foci appear to be acute.    MRI of the cervical spine 12/19/2021 shows T2 hyperintense focus adjacent to C5-C6 anterior midline.  More subtle small focus to the left at C3-C4 and possibly to the right adjacent to C4-C5.[name]   These foci were present on the 2018 MRI but  only the C4-C5 focus was present on the 10/14/2013 MRI  LABS 12/202/22 VIt D = 29.8; IgG=862; IgM=15  REVIEW OF SYSTEMS: Constitutional: No fevers, chills, sweats, or change in appetite Eyes: No visual changes, double vision, eye pain Ear, nose and throat: No hearing loss, ear pain, nasal congestion, sore throat Cardiovascular: No chest pain, palpitations Respiratory:  No shortness of breath at rest or with exertion.   No wheezes GastrointestinaI: No nausea, vomiting, diarrhea, abdominal pain, fecal incontinence Genitourinary:  No dysuria, urinary retention or frequency.  No nocturia. Musculoskeletal:  No neck pain, back pain Integumentary: No rash, pruritus, skin lesions Neurological: as above Psychiatric: No depression at this time.  No anxiety Endocrine: No palpitations, diaphoresis, change in appetite, change in weigh or increased thirst Hematologic/Lymphatic:  No anemia, purpura, petechiae. Allergic/Immunologic: No itchy/runny eyes, nasal congestion, recent allergic reactions, rashes  ALLERGIES: No Known Allergies  HOME MEDICATIONS:  Current Outpatient Medications:    acetaminophen (TYLENOL) 325 MG tablet, Take 325 mg by mouth every 6 (six) hours as needed for mild pain or headache., Disp: , Rfl:    amLODipine (NORVASC) 10 MG tablet, Take 10 mg by mouth daily., Disp: , Rfl:    ibuprofen (ADVIL,MOTRIN) 200 MG tablet, Take 400-600 mg by mouth every 8 (eight) hours as needed for headache or mild pain (depends on pain if takes 2-3 tablets)., Disp: , Rfl:    meclizine (ANTIVERT) 25 MG tablet, TAKE 1 TABLET BY MOUTH FOUR TIMES DAILY AS NEEDED FOR DIZZINESS, Disp: 120 tablet, Rfl: 3   modafinil (PROVIGIL) 200 MG tablet, Take 1 tablet (200 mg total) by mouth daily., Disp: 7 tablet, Rfl: 0   ocrelizumab (OCREVUS) 300 MG/10ML injection, Inject 600 mg into the vein See admin instructions. Inject every 6 months per patient, Disp: , Rfl:    oxybutynin (DITROPAN-XL) 5 MG 24 hr tablet, Take 1  tablet (5 mg total) by mouth at bedtime., Disp: 90 tablet, Rfl: 3   rosuvastatin (CRESTOR) 20 MG tablet, Take 20 mg by mouth at bedtime., Disp: , Rfl:    VITAMIN D PO, Take 800 Units by mouth daily., Disp: , Rfl:    zolpidem (AMBIEN) 10 MG tablet, Take 1 tablet (10 mg total) by mouth at bedtime as needed for sleep. for sleep, Disp: 30 tablet, Rfl: 1   modafinil (PROVIGIL) 200  MG tablet, Take 1 tablet (200 mg total) by mouth daily. (Patient not taking: Reported on 05/26/2023), Disp: 90 tablet, Rfl: 1   oseltamivir (TAMIFLU) 75 MG capsule, Take 1 capsule (75 mg total) by mouth 2 (two) times daily. (Patient not taking: Reported on 05/26/2023), Disp: 10 capsule, Rfl: 0   PRESCRIPTION MEDICATION, Take 1 tablet by mouth daily. Buproprion, pt unsure of dose. Pharmacy has no record, Disp: , Rfl:   PAST MEDICAL HISTORY: Past Medical History:  Diagnosis Date   History of hiatal hernia    MS (multiple sclerosis) (HCC)    Multiple sclerosis (HCC) 09/22/2013   Obesity    Optic neuritis, left     PAST SURGICAL HISTORY: Past Surgical History:  Procedure Laterality Date   HERNIA REPAIR     2005/2007 Left siide hiatal hernia   INCISIONAL HERNIA REPAIR N/A 10/20/2017   Procedure: REPAIR OF RECURRENT INCISIONAL HERNIA WITH MESH;  Surgeon: Violeta Gelinas, MD;  [facility] OR;  Service: General;  Laterality: N/A;   INSERTION OF MESH N/A 10/20/2017   Procedure: INSERTION OF MESH;  Surgeon: Violeta Gelinas, MD;  Location: [facility] OR;  Service: General;  Laterality: N/A;    FAMILY HISTORY: Family History  Problem Relation Age of Onset   Leukemia Sister     SOCIAL HISTORY:  Social History   Socioeconomic History   Marital status: Married    Spouse name: Not on file   Number of children: 3   Years of education: hs   Highest education level: Not on file  Occupational History   Not on file  Tobacco Use   Smoking status: Former    Types: Cigarettes    Quit date: 10/15/2008    Years since quitting: 14.6    Smokeless tobacco: Never   Tobacco comments:    quit 03/2006  Vaping Use   Vaping Use: Never used  Substance and Sexual Activity   Alcohol use: No   Drug use: No   Sexual activity: Not on file  Other Topics Concern   Not on file  Social History Narrative   Lives with wife   Caffeine use: Coffee (1-cup per day)   Left handed   Social Determinants of Health   Financial Resource Strain: Not on file  Food Insecurity: Not on file  Transportation Needs: Not on file  Physical Activity: Not on file  Stress: Not on file  Social Connections: Not on file  Intimate Partner Violence: Not on file     PHYSICAL EXAM  Vitals:   05/26/23 1403  BP: 107/73  Pulse: 73  Weight: 292 lb 8 oz (132.7 kg)  Height: 6\' 1"  (1.854 m)    Body mass index is 38.59 kg/m.   General: The patient is well-developed and well-nourished and in no acute distress  HEENT:  Head is [place]/AT.  Sclera are anicteric.    Skin: Extremities are without rash or  edema.  Neurologic Exam  Mental status: The patient is alert and oriented x 3 at the time of the examination. The patient has apparent normal recent and remote memory, with an apparently normal attention span and concentration ability.   Speech is normal.  Cranial nerves: Extraocular movements are full. Pupils are equal, round, and reactive to light and accomodation.  Mildly reduced color visio OS. .  Facial symmetry is present. There is good facial sensation to soft touch bilaterally.Facial strength is normal.  Trapezius and sternocleidomastoid strength is normal. No dysarthria is noted.  The tongue is midline,  and the patient has symmetric elevation of the soft palate. No obvious hearing deficits are noted.   However Weber lateralized left.  Motor:  Muscle bulk is normal.   Tone is normal. Strength is  5 / 5 in all 4 extremities.   Sensory: Sensory testing is intact to pinprick, soft touch and vibration sensation in all 4 extremities.  Coordination:  Cerebellar testing reveals good finger-nose-finger and heel-to-shin bilaterally.  Gait and station: Station is normal.   Gait is normal. Tandem gait is mildly wide.  Romberg is negative Reflexes: Deep tendon reflexes are symmetric and normal bilaterally.   Plantar responses are flexor.    DIAGNOSTIC DATA (LABS, IMAGING, TESTING) - I reviewed patient records, labs, notes, testing and imaging myself where available.  Lab Results  Component Value Date   WBC 8.6 12/11/2022   HGB 14.9 12/11/2022   HCT 43.8 12/11/2022   MCV 90 12/11/2022   PLT 245 12/11/2022      Component Value Date/Time   NA 144 12/11/2021 1111   K 4.7 12/11/2021 1111   CL 103 12/11/2021 1111   CO2 25 12/11/2021 1111   GLUCOSE 92 12/11/2021 1111   GLUCOSE 127 (H) 09/05/2019 1857   BUN 10 12/11/2021 1111   CREATININE 0.95 12/11/2021 1111   CALCIUM 9.8 12/11/2021 1111   PROT 7.0 12/11/2021 1111   ALBUMIN 4.4 12/11/2021 1111   AST 27 12/11/2021 1111   ALT 46 (H) 12/11/2021 1111   ALKPHOS 74 12/11/2021 1111   BILITOT 0.4 12/11/2021 1111   GFRNONAA 75 01/11/2021 1408   GFRAA 87 01/11/2021 1408   No results found for: "CHOL", "HDL", "LDLCALC", "LDLDIRECT", "TRIG", "CHOLHDL" No results found for: "HGBA1C" Lab Results  Component Value Date   VITAMINB12 959 07/30/2017   No results found for: "TSH"     ASSESSMENT AND PLAN  Multiple sclerosis (HCC) - Plan: MR BRAIN W WO CONTRAST, IgG, IgA, IgM, CBC with Differential/Platelet  OSA (obstructive sleep apnea)  Urinary urgency  Fatigue, unspecified type  High risk medication use - Plan: IgG, IgA, IgM, CBC with Differential/Platelet  Vitamin D deficiency - Plan: VITAMIN D 25 Hydroxy (Vit-D Deficiency, Fractures)   He will continue Ocrevus.  We will check some lab work including IgG/IgM and CBC with differential today.  Vitamin D.  We will check an MRI of the brain to determine if there has been subclinical progression.  If this is occurring we will need to  consider a different disease modifying therapy. He is tolerating CPAP well and feels better when he uses it.  However, without knowing it, in the middle of the night he is often taking his mask off.  We discussed trying to sleep with the head more elevated as that will encourage back sleeping. He will return to see [place] in 6 months or sooner if there are new or worsening neurologic symptoms.   Francene Mcerlean A. Epimenio Foot, MD, [facility] 05/26/2023, 2:47 PM Certified in Neurology, Clinical Neurophysiology, Sleep Medicine and Neuroimaging  [facility] [address] [place], [place] [address] [phone]

## 2023-05-26 NOTE — Telephone Encounter (Signed)
sent to [facility] they obtain auth [id number]

## 2023-05-27 ENCOUNTER — Telehealth: Payer: Self-pay | Admitting: *Deleted

## 2023-05-27 LAB — CBC WITH DIFFERENTIAL/PLATELET
Basophils Absolute: 0 10*3/uL (ref 0.0–0.2)
Basos: 1 %
EOS (ABSOLUTE): 0.1 10*3/uL (ref 0.0–0.4)
Eos: 1 %
Hematocrit: 42.7 % (ref 37.5–51.0)
Hemoglobin: 14.3 g/dL (ref 13.0–17.7)
Immature Grans (Abs): 0 10*3/uL (ref 0.0–0.1)
Immature Granulocytes: 0 %
Lymphocytes Absolute: 1.2 10*3/uL (ref 0.7–3.1)
Lymphs: 17 %
MCH: 29.7 pg (ref 26.6–33.0)
MCHC: 33.5 g/dL (ref 31.5–35.7)
MCV: 89 fL (ref 79–97)
Monocytes Absolute: 0.6 10*3/uL (ref 0.1–0.9)
Monocytes: 8 %
Neutrophils Absolute: 5.1 10*3/uL (ref 1.4–7.0)
Neutrophils: 73 %
Platelets: 242 10*3/uL (ref 150–450)
RBC: 4.81 x10E6/uL (ref 4.14–5.80)
RDW: 12.8 % (ref 11.6–15.4)
WBC: 7 10*3/uL (ref 3.4–10.8)

## 2023-05-27 LAB — IGG, IGA, IGM
IgA/Immunoglobulin A, Serum: 334 mg/dL (ref 90–386)
IgG (Immunoglobin G), Serum: 748 mg/dL (ref 603–1613)
IgM (Immunoglobulin M), Srm: 8 mg/dL — ABNORMAL LOW (ref 20–172)

## 2023-05-27 LAB — VITAMIN D 25 HYDROXY (VIT D DEFICIENCY, FRACTURES): Vit D, 25-Hydroxy: 29.4 ng/mL — ABNORMAL LOW (ref 30.0–100.0)

## 2023-05-27 NOTE — Telephone Encounter (Signed)
-----   Message from Asa Lente, MD sent at 05/27/2023 12:42 PM EDT ----- His IgM level has dropped a little bit further and I would like to switch him to Ocrevus 600 mg every 8 months

## 2023-05-27 NOTE — Telephone Encounter (Signed)
Patient informed with all the below.  New order signed and given to [facility] intrafusion.

## 2023-06-12 ENCOUNTER — Ambulatory Visit: Payer: Managed Care, Other (non HMO) | Admitting: Neurology

## 2023-06-17 ENCOUNTER — Ambulatory Visit: Payer: Managed Care, Other (non HMO) | Admitting: Neurology

## 2023-07-08 ENCOUNTER — Encounter: Payer: Self-pay | Admitting: Neurology

## 2023-07-09 ENCOUNTER — Other Ambulatory Visit: Payer: Self-pay

## 2023-07-09 MED ORDER — MODAFINIL 200 MG PO TABS: 200.0000 mg | ORAL_TABLET | Freq: Every day | ORAL | 1 refills | Status: DC

## 2023-07-09 NOTE — Telephone Encounter (Signed)
Requested Prescriptions   Pending Prescriptions Disp Refills   modafinil (PROVIGIL) 200 MG tablet 7 tablet 0    Sig: Take 1 tablet (200 mg total) by mouth daily.   Last seen 05/26/23, next appt scheduled 11/26/23 Dispenses  Routing to provider to fill Dispensed Days Supply Quantity Provider Pharmacy  MODAFINIL 200 MG TABLET 04/13/2023 90 90 tablet Glean Salvo, NP [facility]...  MODAFINIL 200 MG TABLET 12/30/2022 90 90 tablet Glean Salvo, NP [facility]...  MODAFINIL 200 MG TABLET 10/08/2022 90 90 tablet Sater, Pearletha Furl, MD [facility].Marland KitchenMarland Kitchen

## 2023-07-15 ENCOUNTER — Telehealth: Payer: Self-pay | Admitting: Neurology

## 2023-07-15 ENCOUNTER — Encounter: Payer: Self-pay | Admitting: Neurology

## 2023-07-15 NOTE — Telephone Encounter (Signed)
[facility] (Sn checking on status of authorization MRI of the brain with and without contrast. If do not hear back by 12 pm today will cancel appt. Will schedule when we receive the authorization

## 2023-07-15 NOTE — Telephone Encounter (Signed)
I messaged GI and they said they submitted an [place] today for him to [place] and he is good to go.

## 2023-07-16 ENCOUNTER — Ambulatory Visit
Admission: RE | Admit: 2023-07-16 | Discharge: 2023-07-16 | Disposition: A | Discharge status: Home / Self Care | Payer: Managed Care, Other (non HMO) | Source: Ambulatory Visit | Attending: Neurology | Admitting: Neurology

## 2023-07-16 DIAGNOSIS — G35 Multiple sclerosis: Secondary | ICD-10-CM | POA: Diagnosis not present

## 2023-07-16 MED ORDER — GADOPICLENOL 0.5 MMOL/ML IV SOLN
10.0000 mL | Freq: Once | INTRAVENOUS | Status: AC | PRN
  Administered 2023-07-16: 10 mL via INTRAVENOUS

## 2023-07-30 ENCOUNTER — Encounter: Payer: Self-pay | Admitting: Neurology

## 2023-07-31 ENCOUNTER — Other Ambulatory Visit: Payer: Self-pay | Admitting: Neurology

## 2023-07-31 MED ORDER — AZITHROMYCIN 250 MG PO TABS: ORAL_TABLET | ORAL | 0 refills | Status: AC

## 2023-11-19 ENCOUNTER — Encounter: Payer: Self-pay | Admitting: Neurology

## 2023-11-24 ENCOUNTER — Other Ambulatory Visit: Payer: Self-pay | Admitting: *Deleted

## 2023-11-26 ENCOUNTER — Ambulatory Visit (INDEPENDENT_AMBULATORY_CARE_PROVIDER_SITE_OTHER): Payer: Managed Care, Other (non HMO) | Admitting: Neurology

## 2023-11-26 ENCOUNTER — Encounter: Payer: Self-pay | Admitting: Neurology

## 2023-11-26 VITALS — BP 127/85 | HR 71 | Ht 73.0 in | Wt 292.0 lb

## 2023-11-26 DIAGNOSIS — R3915 Urgency of urination: Secondary | ICD-10-CM | POA: Diagnosis not present

## 2023-11-26 DIAGNOSIS — G4733 Obstructive sleep apnea (adult) (pediatric): Secondary | ICD-10-CM

## 2023-11-26 DIAGNOSIS — G35 Multiple sclerosis: Secondary | ICD-10-CM

## 2023-11-26 DIAGNOSIS — R5383 Other fatigue: Secondary | ICD-10-CM

## 2023-11-26 MED ORDER — MECLIZINE HCL 25 MG PO TABS: ORAL_TABLET | ORAL | 5 refills | Status: DC

## 2023-11-26 MED ORDER — OXYBUTYNIN CHLORIDE ER 5 MG PO TB24: 5.0000 mg | ORAL_TABLET | Freq: Every day | ORAL | 3 refills | Status: DC

## 2023-11-26 NOTE — Patient Instructions (Addendum)
We will continue Ocrevus every 8 months.  Continue chronic long-term medications.  Check labs today.  Continue to try to use CPAP nightly for minimum 4 hours.

## 2023-11-26 NOTE — Progress Notes (Signed)
[facility]  PATIENT: Daniel Chavez DOB: [birth year]  REFERRING DOCTOR OR PCP: Henrine Screws, MD SOURCE: Patient, notes from Dr. Anne Hahn and Margie Ege, NP, imaging and lab reports, MRI images personally reviewed. _______________________________  HISTORICAL  CHIEF COMPLAINT:  Chief Complaint  Patient presents with   Multiple Sclerosis    RM 15, alone, MS:pt is well and has no concerns    HISTORY OF PRESENT ILLNESS:  Today November 26, 2023 SS:  MRI of the brain July 2024 showed stable MS lesions.  IgM dropped to 8, we reduced Ocrevus 600 mg every 8 months.  Vitamin D 29.4. Next Ocrevus is around Feb 2025. MS is stable. Walking okay, still working. Balance is not great, doesn't fall. Has to sit down to put socks on. No weakness. Remains on Ditropan, has urgency. Doesn't use CPAP consistently, just can't wear it through the night, doesn't notice much difference. Remains on Vitamin D. He takes meclizine 1-3 times daily for dizziness, it helps well. He does have cognitive symptoms, can be harder to talk. I tried to pull CPAP download today, but couldn't find any recent data.  05/26/23 Dr. Epimenio Foot: Rury Nisbett ia a 47 y.o. man with relapsing remitting multiple sclerosis  He was diagnosed with MS in 2008 after presenting with optic neuritis.  He is on Ocrevus since 2020 and has been stable over the past couple years..   Last infusion was 02/03/2023  Currently, he is walking well.   Gait is mildly off due to balance.   He stumbles but has no falls.  He uses the bannister on stairs.   Denies much weakness or spasticity.     He has occasional numbness and tingling in his right hand from the wrist and down.  He has urinary urgency helped by oxybutynin.      He notes more difficult with near vision.    He denies asymmetry in visual acuity though colors are mildly washed out OS  He has dizziness with lightheadedness when he stands and some spinning when he moves his head.       He notes some fatigue but feels less sleepy since starting CPAP.[name]   He sleeps well at night.    He notes some cognitive difficulty.   Focusing is difficult.     Word finding is a problem at times.    He needs to think about his words more.   Mood is doing well.    He is on CPAP auto-pap 5-15 cm.    HST showed moderate OSA.    Download shows only 27% 4 hour use though he uses it at last 1 hour every night.   He takes the mask off without even knowing it (or it falls off).   He does not find the mask uncomfortable.   Efficacy is good with AHI 2.6/h when he wears it.   He uses a FFM that is comfortable and snug.      He does not wake up to use the bathroom.    If he wears I all night or alt elast 4 hours, he feels better the next day.   He is less sleepy since starting CPAP.     He is trying to lose weight with diet and has lost a few pounds   EPWORTH SLEEPINESS SCALE  On a scale of 0 - 3 what is the chance of dozing:  Sitting and Reading:   1 Watching TV:    0 Sitting inactive in  a public place: 0 Passenger in car for one hour: 0 Lying down to rest in the afternoon: 2 Sitting and talking to someone: 0 Sitting quietly after lunch:  2 In a car, stopped in traffic:  0  Total (out of 24):    5/24   MS History: He was diagnosed in 2008 after presenting with left optic neuritis.   He had an MRI ad it was abnormal though he was diagnosed with just probable MS at the time.   He was started on Copaxone and continued until around 2013.   He stopped due to injection reactions.   Copaxone was represcribed around 2014 but he decided not to take it.  Around 2016, after being off Copaxone x 2 years, he had an exacerbation with reduced gait and slurred speech.   He did Tysabri x 2 - 3 years starting in 2018 but due to an elevated JCV Ab titer, he started Ocrevus in 2020.   Imaging: MRI of the brain 12/19/2021 shows multiple T2/FLAIR hyperintense foci in the periventricular, juxtacortical and deep white  matter of both cerebral hemispheres.  In the infratentorial white matter there just a couple punctate foci in the cerebellum and 1 subtle focus in the medulla..  None of the foci appear to be acute.    MRI of the cervical spine 12/19/2021 shows T2 hyperintense focus adjacent to C5-C6 anterior midline.  More subtle small focus to the left at C3-C4 and possibly to the right adjacent to C4-C5.[name]   These foci were present on the 2018 MRI but only the C4-C5 focus was present on the 10/14/2013 MRI  LABS 12/202/22 VIt D = 29.8; IgG=862; IgM=15  REVIEW OF SYSTEMS: Constitutional: No fevers, chills, sweats, or change in appetite Eyes: No visual changes, double vision, eye pain Ear, nose and throat: No hearing loss, ear pain, nasal congestion, sore throat Cardiovascular: No chest pain, palpitations Respiratory:  No shortness of breath at rest or with exertion.   No wheezes GastrointestinaI: No nausea, vomiting, diarrhea, abdominal pain, fecal incontinence Genitourinary:  No dysuria, urinary retention or frequency.  No nocturia. Musculoskeletal:  No neck pain, back pain Integumentary: No rash, pruritus, skin lesions Neurological: as above Psychiatric: No depression at this time.  No anxiety Endocrine: No palpitations, diaphoresis, change in appetite, change in weigh or increased thirst Hematologic/Lymphatic:  No anemia, purpura, petechiae. Allergic/Immunologic: No itchy/runny eyes, nasal congestion, recent allergic reactions, rashes  ALLERGIES: No Known Allergies  HOME MEDICATIONS:  Current Outpatient Medications:    acetaminophen (TYLENOL) 325 MG tablet, Take 325 mg by mouth every 6 (six) hours as needed for mild pain or headache., Disp: , Rfl:    amLODipine (NORVASC) 10 MG tablet, Take 10 mg by mouth daily., Disp: , Rfl:    azithromycin (ZITHROMAX Z-PAK) 250 MG tablet, Take 2 tablets p.o. on the first day then take 1 p.o. daily until complete, Disp: 6 each, Rfl: 0   ibuprofen (ADVIL,MOTRIN)  200 MG tablet, Take 400-600 mg by mouth every 8 (eight) hours as needed for headache or mild pain (depends on pain if takes 2-3 tablets)., Disp: , Rfl:    meclizine (ANTIVERT) 25 MG tablet, TAKE 1 TABLET BY MOUTH FOUR TIMES DAILY AS NEEDED FOR DIZZINESS, Disp: 120 tablet, Rfl: 3   modafinil (PROVIGIL) 200 MG tablet, Take 1 tablet (200 mg total) by mouth daily., Disp: 90 tablet, Rfl: 1   modafinil (PROVIGIL) 200 MG tablet, Take 1 tablet (200 mg total) by mouth daily., Disp: 90 tablet, Rfl:  1   ocrelizumab (OCREVUS) 300 MG/10ML injection, Inject 600 mg into the vein See admin instructions. Inject every 6 months per patient, Disp: , Rfl:    oseltamivir (TAMIFLU) 75 MG capsule, Take 1 capsule (75 mg total) by mouth 2 (two) times daily., Disp: 10 capsule, Rfl: 0   oxybutynin (DITROPAN-XL) 5 MG 24 hr tablet, Take 1 tablet (5 mg total) by mouth at bedtime., Disp: 90 tablet, Rfl: 3   PRESCRIPTION MEDICATION, Take 1 tablet by mouth daily. Buproprion, pt unsure of dose. Pharmacy has no record, Disp: , Rfl:    rosuvastatin (CRESTOR) 20 MG tablet, Take 20 mg by mouth at bedtime., Disp: , Rfl:    VITAMIN D PO, Take 800 Units by mouth daily., Disp: , Rfl:    zolpidem (AMBIEN) 10 MG tablet, Take 1 tablet (10 mg total) by mouth at bedtime as needed for sleep. for sleep, Disp: 30 tablet, Rfl: 1  PAST MEDICAL HISTORY: Past Medical History:  Diagnosis Date   History of hiatal hernia    MS (multiple sclerosis) (HCC)    Multiple sclerosis (HCC) 09/22/2013   Obesity    Optic neuritis, left     PAST SURGICAL HISTORY: Past Surgical History:  Procedure Laterality Date   HERNIA REPAIR     2005/2007 Left siide hiatal hernia   INCISIONAL HERNIA REPAIR N/A 10/20/2017   Procedure: REPAIR OF RECURRENT INCISIONAL HERNIA WITH MESH;  Surgeon: Violeta Gelinas, MD;  Location: [facility] OR;  Service: General;  Laterality: N/A;   INSERTION OF MESH N/A 10/20/2017   Procedure: INSERTION OF MESH;  Surgeon: Violeta Gelinas, MD;   Location: [facility] OR;  Service: General;  Laterality: N/A;    FAMILY HISTORY: Family History  Problem Relation Age of Onset   Leukemia Sister     SOCIAL HISTORY:  Social History   Socioeconomic History   Marital status: Married    Spouse name: Not on file   Number of children: 3   Years of education: hs   Highest education level: Not on file  Occupational History   Not on file  Tobacco Use   Smoking status: Former    Current packs/day: 0.00    Types: Cigarettes    Quit date: 10/15/2008    Years since quitting: 15.1   Smokeless tobacco: Never   Tobacco comments:    quit 03/2006  Vaping Use   Vaping status: Never Used  Substance and Sexual Activity   Alcohol use: No   Drug use: No   Sexual activity: Not on file  Other Topics Concern   Not on file  Social History Narrative   Lives with wife   Caffeine use: Coffee (1-cup per day)   Left handed   Social Determinants of Health   Financial Resource Strain: Not on file  Food Insecurity: Not on file  Transportation Needs: Not on file  Physical Activity: Not on file  Stress: Not on file  Social Connections: Unknown (05/07/2022)   Received from [facility], [facility]   Social Network    Social Network: Not on file  Intimate Partner Violence: Unknown (03/29/2022)   Received from [facility], [facility]   HITS    Physically Hurt: Not on file    Insult or Talk Down To: Not on file    Threaten Physical Harm: Not on file    Scream or Curse: Not on file     PHYSICAL EXAM  Vitals:   11/26/23 1425  BP: 127/85  Pulse: 71  Weight:  292 lb (132.5 kg)  Height: 6\' 1"  (1.854 m)     Body mass index is 38.52 kg/m.   General: The patient is well-developed and well-nourished and in no acute distress  Skin: Extremities are without rash or  edema.  Neurologic Exam  Mental status: The patient is alert and oriented x 3 at the time of the examination. The patient has apparent normal recent and remote memory, with  an apparently normal attention span and concentration ability.   Speech is normal.  Cranial nerves: Extraocular movements are full. Pupils are equal, round, and reactive to light and accomodation.  Facial symmetry is present. There is good facial sensation to soft touch bilaterally.Facial strength is normal.  Trapezius and sternocleidomastoid strength is normal. No dysarthria is noted.    Motor:  Muscle bulk is normal.   Tone is normal. Strength is  5 / 5 in all 4 extremities.   Sensory: Sensory testing is intact to pinprick, soft touch and vibration sensation in all 4 extremities.  Coordination: Cerebellar testing reveals good finger-nose-finger and heel-to-shin bilaterally.  Gait and station: Station is normal.   Gait is normal.   Reflexes: Deep tendon reflexes are symmetric and normal bilaterally.   Plantar responses are flexor.    DIAGNOSTIC DATA (LABS, IMAGING, TESTING) - I reviewed patient records, labs, notes, testing and imaging myself where available.  Lab Results  Component Value Date   WBC 7.0 05/26/2023   HGB 14.3 05/26/2023   HCT 42.7 05/26/2023   MCV 89 05/26/2023   PLT 242 05/26/2023      Component Value Date/Time   NA 144 12/11/2021 1111   K 4.7 12/11/2021 1111   CL 103 12/11/2021 1111   CO2 25 12/11/2021 1111   GLUCOSE 92 12/11/2021 1111   GLUCOSE 127 (H) 09/05/2019 1857   BUN 10 12/11/2021 1111   CREATININE 0.95 12/11/2021 1111   CALCIUM 9.8 12/11/2021 1111   PROT 7.0 12/11/2021 1111   ALBUMIN 4.4 12/11/2021 1111   AST 27 12/11/2021 1111   ALT 46 (H) 12/11/2021 1111   ALKPHOS 74 12/11/2021 1111   BILITOT 0.4 12/11/2021 1111   GFRNONAA 75 01/11/2021 1408   GFRAA 87 01/11/2021 1408   No results found for: "CHOL", "HDL", "LDLCALC", "LDLDIRECT", "TRIG", "CHOLHDL" No results found for: "HGBA1C" Lab Results  Component Value Date   VITAMINB12 959 07/30/2017   No results found for: "TSH"     ASSESSMENT AND PLAN  1.  Multiple sclerosis 2.  OSA on  CPAP 3.  Fatigue  -He will continue Ocrevus 600 mg every 8 months.  Dosing is lowered due to low IgM. -Check CBC, IgG IgA IgM today -He will continue to try to use CPAP nightly for minimum of 4 hours -MRI of the brain in July 2024 showed good stability -We will continue chronic medications including: Meclizine, Provigil, oxybutynin. -Follow-up in 6 months or sooner if needed   [name], DNP  [facility] [address], [address] [place], [place] [address] [phone]

## 2023-11-27 LAB — CBC WITH DIFFERENTIAL/PLATELET
Basophils Absolute: 0 10*3/uL (ref 0.0–0.2)
Basos: 1 %
EOS (ABSOLUTE): 0.1 10*3/uL (ref 0.0–0.4)
Eos: 1 %
Hematocrit: 45.2 % (ref 37.5–51.0)
Hemoglobin: 15 g/dL (ref 13.0–17.7)
Immature Grans (Abs): 0 10*3/uL (ref 0.0–0.1)
Immature Granulocytes: 0 %
Lymphocytes Absolute: 1.5 10*3/uL (ref 0.7–3.1)
Lymphs: 20 %
MCH: 31 pg (ref 26.6–33.0)
MCHC: 33.2 g/dL (ref 31.5–35.7)
MCV: 93 fL (ref 79–97)
Monocytes Absolute: 0.7 10*3/uL (ref 0.1–0.9)
Monocytes: 9 %
Neutrophils Absolute: 5 10*3/uL (ref 1.4–7.0)
Neutrophils: 69 %
Platelets: 245 10*3/uL (ref 150–450)
RBC: 4.84 x10E6/uL (ref 4.14–5.80)
RDW: 12 % (ref 11.6–15.4)
WBC: 7.3 10*3/uL (ref 3.4–10.8)

## 2023-11-27 LAB — IGG, IGA, IGM
IgA/Immunoglobulin A, Serum: 385 mg/dL (ref 90–386)
IgG (Immunoglobin G), Serum: 866 mg/dL (ref 603–1613)
IgM (Immunoglobulin M), Srm: 10 mg/dL — ABNORMAL LOW (ref 20–172)

## 2024-01-11 ENCOUNTER — Encounter: Payer: Self-pay | Admitting: Neurology

## 2024-01-12 ENCOUNTER — Other Ambulatory Visit: Payer: Self-pay

## 2024-01-12 MED ORDER — MODAFINIL 200 MG PO TABS: 200.0000 mg | ORAL_TABLET | Freq: Every day | ORAL | 1 refills | Status: DC

## 2024-01-12 MED ORDER — MECLIZINE HCL 25 MG PO TABS: ORAL_TABLET | ORAL | 5 refills | Status: DC

## 2024-01-12 NOTE — Telephone Encounter (Signed)
Pt last Seen 11/26/2023 Loni Dolly) Upcoming Appointment 06/29/2024 (NP, Margie Ege)  Meclizine last filled 11/26/2023 Provigil Last Filled 10/09/2023 Oxybutynin last filled 01/10/2024 90 day supply   Pt is requesting Scripts be sent to [facility] on [address].   Only sending in Meclizine and Provigil to new pharmacy.

## 2024-02-02 DIAGNOSIS — G35 Multiple sclerosis: Secondary | ICD-10-CM | POA: Diagnosis not present

## 2024-02-20 DIAGNOSIS — H4720 Unspecified optic atrophy: Secondary | ICD-10-CM | POA: Diagnosis not present

## 2024-02-20 DIAGNOSIS — H5213 Myopia, bilateral: Secondary | ICD-10-CM | POA: Diagnosis not present

## 2024-02-20 DIAGNOSIS — G35 Multiple sclerosis: Secondary | ICD-10-CM | POA: Diagnosis not present

## 2024-02-22 DIAGNOSIS — J101 Influenza due to other identified influenza virus with other respiratory manifestations: Secondary | ICD-10-CM | POA: Diagnosis not present

## 2024-02-22 DIAGNOSIS — R509 Fever, unspecified: Secondary | ICD-10-CM | POA: Diagnosis not present

## 2024-02-22 DIAGNOSIS — J029 Acute pharyngitis, unspecified: Secondary | ICD-10-CM | POA: Diagnosis not present

## 2024-03-03 ENCOUNTER — Telehealth: Payer: Self-pay | Admitting: *Deleted

## 2024-03-03 NOTE — Telephone Encounter (Signed)
 Spoke w/ infusion suite. Pt has been doing 300mg  of Ocrevus q 6 months. Has not been changed to 600mg  q 8 months as requested back 05/2023.   Spoke w/ Dr. Epimenio Foot. He reviewed pt labs 11/2023. Ok to continue 300mg  q 6 months d/t continued low IgM per Dr. Epimenio Foot. Updated order given to intrafusion.

## 2024-03-12 ENCOUNTER — Encounter: Payer: Self-pay | Admitting: Neurology

## 2024-03-15 DIAGNOSIS — E669 Obesity, unspecified: Secondary | ICD-10-CM | POA: Diagnosis not present

## 2024-03-15 DIAGNOSIS — E88819 Insulin resistance, unspecified: Secondary | ICD-10-CM | POA: Diagnosis not present

## 2024-03-15 DIAGNOSIS — I10 Essential (primary) hypertension: Secondary | ICD-10-CM | POA: Diagnosis not present

## 2024-03-15 DIAGNOSIS — E785 Hyperlipidemia, unspecified: Secondary | ICD-10-CM | POA: Diagnosis not present

## 2024-04-12 DIAGNOSIS — Z713 Dietary counseling and surveillance: Secondary | ICD-10-CM | POA: Diagnosis not present

## 2024-04-28 LAB — COLOGUARD: COLOGUARD: NEGATIVE

## 2024-05-18 DIAGNOSIS — Z23 Encounter for immunization: Secondary | ICD-10-CM | POA: Diagnosis not present

## 2024-05-18 DIAGNOSIS — E782 Mixed hyperlipidemia: Secondary | ICD-10-CM | POA: Diagnosis not present

## 2024-05-18 DIAGNOSIS — Z0000 Encounter for general adult medical examination without abnormal findings: Secondary | ICD-10-CM | POA: Diagnosis not present

## 2024-05-18 DIAGNOSIS — L989 Disorder of the skin and subcutaneous tissue, unspecified: Secondary | ICD-10-CM | POA: Diagnosis not present

## 2024-05-18 DIAGNOSIS — N529 Male erectile dysfunction, unspecified: Secondary | ICD-10-CM | POA: Diagnosis not present

## 2024-05-18 DIAGNOSIS — I10 Essential (primary) hypertension: Secondary | ICD-10-CM | POA: Diagnosis not present

## 2024-06-24 DIAGNOSIS — D2321 Other benign neoplasm of skin of right ear and external auricular canal: Secondary | ICD-10-CM | POA: Diagnosis not present

## 2024-06-24 DIAGNOSIS — L817 Pigmented purpuric dermatosis: Secondary | ICD-10-CM | POA: Diagnosis not present

## 2024-06-29 ENCOUNTER — Encounter: Payer: Self-pay | Admitting: Neurology

## 2024-06-29 ENCOUNTER — Ambulatory Visit (INDEPENDENT_AMBULATORY_CARE_PROVIDER_SITE_OTHER): Payer: Managed Care, Other (non HMO) | Admitting: Neurology

## 2024-06-29 VITALS — BP 136/82 | Ht 73.0 in | Wt 290.0 lb

## 2024-06-29 DIAGNOSIS — G35A Relapsing-remitting multiple sclerosis: Secondary | ICD-10-CM

## 2024-06-29 DIAGNOSIS — G35 Multiple sclerosis: Secondary | ICD-10-CM | POA: Diagnosis not present

## 2024-06-29 DIAGNOSIS — G35D Multiple sclerosis, unspecified: Secondary | ICD-10-CM

## 2024-06-29 MED ORDER — MODAFINIL 200 MG PO TABS: 200.0000 mg | ORAL_TABLET | Freq: Every day | ORAL | 1 refills | Status: AC

## 2024-06-29 MED ORDER — MECLIZINE HCL 25 MG PO TABS: ORAL_TABLET | ORAL | 5 refills | Status: AC

## 2024-06-29 NOTE — Progress Notes (Addendum)
 "  [facility]  PATIENT: Daniel Chavez DOB: 06-24-1976  REFERRING DOCTOR OR PCP: Daniel Ng, MD SOURCE: Patient, notes from Dr. Jenel and Daniel Born, NP, imaging and lab reports, MRI images personally reviewed. _______________________________  HISTORICAL  CHIEF COMPLAINT:  Chief Complaint  Patient presents with   Follow-up    Rm 15, MS follow up,  doing well, no new symptoms   HISTORY OF PRESENT ILLNESS:  06/29/24 SS: Dec 2024 IGM low at 10. Went to [place] in May for 2 weeks. Last month went to [place] . MS is doing well, stable. Next infusion is in October, last was Feb. Continues working full time. Is still tired, doesn't allow himself to rest. He loves doing yard work. Has some balance issues, but always the same. No weakness. Vision is okay, sees eye doctor, trouble with near vision. Urgency is better with oxybutynin . Has noted some spotty skin change to lower legs, saw derm, advised compression stocking for circulation. Remains on Ocrevus  300 mg, but order is every 6 months, will need to clarify with infusion, as they have him scheduled in October.  Not currently using CPAP consistently.  11/26/23 SS: MRI of the brain July 2024 showed stable MS lesions.  IgM dropped to 8, we reduced Ocrevus  600 mg every 8 months.  Vitamin D  29.4. Next Ocrevus  is around Feb 2025. MS is stable. Walking okay, still working. Balance is not great, doesn't fall. Has to sit down to put socks on. No weakness. Remains on Ditropan , has urgency. Doesn't use CPAP consistently, just can't wear it through the night, doesn't notice much difference. Remains on Vitamin D . He takes meclizine  1-3 times daily for dizziness, it helps well. He does have cognitive symptoms, can be harder to talk. I tried to pull CPAP download today, but couldn't find any recent data.  05/26/23 Dr. Vear: Daniel Chavez ia a 48 y.o. man with relapsing remitting multiple sclerosis  He was diagnosed with MS in 2008 after  presenting with optic neuritis.  He is on Ocrevus  since 2020 and has been stable over the past couple years..   Last infusion was 02/03/2023  Currently, he is walking well.   Gait is mildly off due to balance.   He stumbles but has no falls.  He uses the bannister on stairs.   Denies much weakness or spasticity.     He has occasional numbness and tingling in his right hand from the wrist and down.  He has urinary urgency helped by oxybutynin .      He notes more difficult with near vision.    He denies asymmetry in visual acuity though colors are mildly washed out OS  He has dizziness with lightheadedness when he stands and some spinning when he moves his head.      He notes some fatigue but feels less sleepy since starting CPAP.Daniel Chavez   He sleeps well at night.    He notes some cognitive difficulty.   Focusing is difficult.     Word finding is a problem at times.    He needs to think about his words more.   Mood is doing well.    He is on CPAP auto-pap 5-15 cm.    HST showed moderate OSA.    Download shows only 27% 4 hour use though he uses it at last 1 hour every night.   He takes the mask off without even knowing it (or it falls off).   He does not find the mask uncomfortable.  Efficacy is good with AHI 2.6/h when he wears it.   He uses a FFM that is comfortable and snug.      He does not wake up to use the bathroom.    If he wears I all night or alt elast 4 hours, he feels better the next day.   He is less sleepy since starting CPAP.     He is trying to lose weight with diet and has lost a few pounds   EPWORTH SLEEPINESS SCALE  On a scale of 0 - 3 what is the chance of dozing:  Sitting and Reading:   1 Watching TV:    0 Sitting inactive in a public place: 0 Passenger in car for one hour: 0 Lying down to rest in the afternoon: 2 Sitting and talking to someone: 0 Sitting quietly after lunch:  2 In a car, stopped in traffic:  0  Total (out of 24):    5/24   MS History: He was diagnosed in  2008 after presenting with left optic neuritis.   He had an MRI ad it was abnormal though he was diagnosed with just probable MS at the time.   He was started on Copaxone and continued until around 2013.   He stopped due to injection reactions.   Copaxone was represcribed around 2014 but he decided not to take it.  Around 2016, after being off Copaxone x 2 years, he had an exacerbation with reduced gait and slurred speech.   He did Tysabri x 2 - 3 years starting in 2018 but due to an elevated JCV Ab titer, he started Ocrevus  in 2020.   Imaging: MRI of the brain 12/19/2021 shows multiple T2/FLAIR hyperintense foci in the periventricular, juxtacortical and deep white matter of both cerebral hemispheres.  In the infratentorial white matter there just a couple punctate foci in the cerebellum and 1 subtle focus in the medulla..  None of the foci appear to be acute.    MRI of the cervical spine 12/19/2021 shows T2 hyperintense focus adjacent to C5-C6 anterior midline.  More subtle small focus to the left at C3-C4 and possibly to the right adjacent to C4-C5.Daniel Chavez   These foci were present on the 2018 MRI but only the C4-C5 focus was present on the 10/14/2013 MRI  LABS 12/202/22 VIt D = 29.8; IgG=862; IgM=15  REVIEW OF SYSTEMS: See HPI  ALLERGIES: No Known Allergies  HOME MEDICATIONS:  Current Outpatient Medications:    amLODipine (NORVASC) 10 MG tablet, Take 10 mg by mouth daily., Disp: , Rfl:    meclizine  (ANTIVERT ) 25 MG tablet, TAKE 1 TABLET BY MOUTH FOUR TIMES DAILY AS NEEDED FOR DIZZINESS, Disp: 120 tablet, Rfl: 5   modafinil  (PROVIGIL ) 200 MG tablet, Take 1 tablet (200 mg total) by mouth daily., Disp: 90 tablet, Rfl: 1   ocrelizumab  (OCREVUS ) 300 MG/10ML injection, Inject 600 mg into the vein See admin instructions. Inject every 6 months per patient, Disp: , Rfl:    oxybutynin  (DITROPAN -XL) 5 MG 24 hr tablet, Take 1 tablet (5 mg total) by mouth at bedtime., Disp: 90 tablet, Rfl: 3   PRESCRIPTION  MEDICATION, Take 1 tablet by mouth daily. Buproprion, pt unsure of dose. Pharmacy has no record, Disp: , Rfl:    rosuvastatin (CRESTOR) 20 MG tablet, Take 20 mg by mouth at bedtime., Disp: , Rfl:    VITAMIN D  PO, Take 800 Units by mouth daily., Disp: , Rfl:    zolpidem  (AMBIEN ) 10 MG tablet, Take  1 tablet (10 mg total) by mouth at bedtime as needed for sleep. for sleep, Disp: 30 tablet, Rfl: 1   acetaminophen  (TYLENOL ) 325 MG tablet, Take 325 mg by mouth every 6 (six) hours as needed for mild pain or headache., Disp: , Rfl:    azithromycin  (ZITHROMAX  Z-PAK) 250 MG tablet, Take 2 tablets p.o. on the first day then take 1 p.o. daily until complete, Disp: 6 each, Rfl: 0   ibuprofen  (ADVIL ,MOTRIN ) 200 MG tablet, Take 400-600 mg by mouth every 8 (eight) hours as needed for headache or mild pain (depends on pain if takes 2-3 tablets)., Disp: , Rfl:    oseltamivir  (TAMIFLU ) 75 MG capsule, Take 1 capsule (75 mg total) by mouth 2 (two) times daily., Disp: 10 capsule, Rfl: 0  PAST MEDICAL HISTORY: Past Medical History:  Diagnosis Date   History of hiatal hernia    MS (multiple sclerosis) (HCC)    Multiple sclerosis (HCC) 09/22/2013   Obesity    Optic neuritis, left     PAST SURGICAL HISTORY: Past Surgical History:  Procedure Laterality Date   HERNIA REPAIR     2005/2007 Left siide hiatal hernia   INCISIONAL HERNIA REPAIR N/A 10/20/2017   Procedure: REPAIR OF RECURRENT INCISIONAL HERNIA WITH MESH;  Surgeon: Sebastian Moles, MD;  Location: [facility] OR;  Service: General;  Laterality: N/A;   INSERTION OF MESH N/A 10/20/2017   Procedure: INSERTION OF MESH;  Surgeon: Sebastian Moles, MD;  Location: [facility] OR;  Service: General;  Laterality: N/A;    FAMILY HISTORY: Family History  Problem Relation Age of Onset   Leukemia Sister     SOCIAL HISTORY:  Social History   Socioeconomic History   Marital status: Married    Spouse name: Not on file   Number of children: 3   Years of education: hs   Highest  education level: Not on file  Occupational History   Not on file  Tobacco Use   Smoking status: Former    Current packs/day: 0.00    Types: Cigarettes    Quit date: 10/15/2008    Years since quitting: 15.7   Smokeless tobacco: Never   Tobacco comments:    quit 03/2006  Vaping Use   Vaping status: Never Used  Substance and Sexual Activity   Alcohol use: No   Drug use: No   Sexual activity: Not on file  Other Topics Concern   Not on file  Social History Narrative   Lives with wife   Caffeine use: Coffee (1-cup per day)   Left handed   Social Drivers of Health   Financial Resource Strain: Not on file  Food Insecurity: Not on file  Transportation Needs: Not on file  Physical Activity: Not on file  Stress: Not on file  Social Connections: Unknown (05/07/2022)   [facility]   Social Network    Social Network: Not on file  Intimate Partner Violence: Unknown (03/29/2022)   Received from [facility]   HITS    Physically Hurt: Not on file    Insult or Talk Down To: Not on file    Threaten Physical Harm: Not on file    Scream or Curse: Not on file   PHYSICAL EXAM  Vitals:   06/29/24 1503  BP: 136/82  Weight: 290 lb (131.5 kg)  Height: 6' 1 (1.854 m)   Body mass index is 38.26 kg/m.  General: The patient is well-developed and well-nourished and in no acute distress  Skin: Extremities are  without rash or  edema.  Neurologic Exam  Mental status: The patient is alert and oriented x 3 at the time of the examination. The patient has apparent normal recent and remote memory, with an apparently normal attention span and concentration ability.   Speech is normal.  Cranial nerves: Extraocular movements are full. Pupils are equal, round, and reactive to light and accomodation.  Facial symmetry is present. There is good facial sensation to soft touch bilaterally.Facial strength is normal.  Trapezius and sternocleidomastoid strength is normal. No dysarthria is  noted.    Motor:  Muscle bulk is normal.   Tone is normal. Strength is  5 / 5 in all 4 extremities.   Sensory: Sensory testing is intact to pinprick, soft touch and vibration sensation in all 4 extremities.  Coordination: Cerebellar testing reveals good finger-nose-finger and heel-to-shin bilaterally.  Gait and station: Station is normal.   Gait is normal.   Reflexes: Deep tendon reflexes are symmetric and normal bilaterally.       DIAGNOSTIC DATA (LABS, IMAGING, TESTING) - I reviewed patient records, labs, notes, testing and imaging myself where available.  Lab Results  Component Value Date   WBC 7.3 11/26/2023   HGB 15.0 11/26/2023   HCT 45.2 11/26/2023   MCV 93 11/26/2023   PLT 245 11/26/2023      Component Value Date/Time   NA 144 12/11/2021 1111   K 4.7 12/11/2021 1111   CL 103 12/11/2021 1111   CO2 25 12/11/2021 1111   GLUCOSE 92 12/11/2021 1111   GLUCOSE 127 (H) 09/05/2019 1857   BUN 10 12/11/2021 1111   CREATININE 0.95 12/11/2021 1111   CALCIUM 9.8 12/11/2021 1111   PROT 7.0 12/11/2021 1111   ALBUMIN 4.4 12/11/2021 1111   AST 27 12/11/2021 1111   ALT 46 (H) 12/11/2021 1111   ALKPHOS 74 12/11/2021 1111   BILITOT 0.4 12/11/2021 1111   GFRNONAA 75 01/11/2021 1408   GFRAA 87 01/11/2021 1408   No results found for: CHOL, HDL, LDLCALC, LDLDIRECT, TRIG, CHOLHDL No results found for: YHAJ8R Lab Results  Component Value Date   VITAMINB12 959 07/30/2017   No results found for: TSH     ASSESSMENT AND PLAN  1.  Relapsing remitting multiple sclerosis 2.  OSA on CPAP 3.  Fatigue  - MS overall stable - Check CBC, IgM IgA IgG - Current order for Ocrevus  is 300 mg every 6 months, last was in February, not scheduled until October, will better clarify this once labs result - Claims using CPAP, I don't see any data in Res Med, has called DME about this, can bring CPAP at next Ocrevus  infusion and we can get a card download to review, HST in 2024  showed moderate OSA, he machine is paid for - MRI of the brain in July 2024 showed good stability - Continue chronic medications: Meclizine , Provigil , oxybutynin  - Follow-up in 6 months with Dr. Vear to rotate every few visits with me  Addendum 06/30/24 SS: IGM was low 8, but IGG was normal. Reviewed with Dr. Vear, continue Ocrevus  every 6 months 300 mg.   Addendum 01/20/25 SS: Updated New ICD Code RRMS G35.A Meds ordered this encounter  Medications   modafinil  (PROVIGIL ) 200 MG tablet    Sig: Take 1 tablet (200 mg total) by mouth daily.    Dispense:  90 tablet    Refill:  1   meclizine  (ANTIVERT ) 25 MG tablet    Sig: TAKE 1 TABLET BY MOUTH FOUR TIMES  DAILY AS NEEDED FOR DIZZINESS    Dispense:  120 tablet    Refill:  5   Daniel Gayland MANDES, DNP  [facility] [address] [place], [place] [address] [phone]  "

## 2024-06-29 NOTE — Patient Instructions (Signed)
 Continue Ocrevus.  Check routine labs today.  Recommend CPAP nightly 4 hours.  Continue current medications.  Follow-up in 6 months.  Thanks!!

## 2024-06-30 ENCOUNTER — Ambulatory Visit: Payer: Self-pay | Admitting: Neurology

## 2024-06-30 LAB — CBC WITH DIFFERENTIAL/PLATELET
Basophils Absolute: 0 x10E3/uL (ref 0.0–0.2)
Basos: 1 %
EOS (ABSOLUTE): 0.1 x10E3/uL (ref 0.0–0.4)
Eos: 1 %
Hematocrit: 44.9 % (ref 37.5–51.0)
Hemoglobin: 14.9 g/dL (ref 13.0–17.7)
Immature Grans (Abs): 0 x10E3/uL (ref 0.0–0.1)
Immature Granulocytes: 0 %
Lymphocytes Absolute: 1.1 x10E3/uL (ref 0.7–3.1)
Lymphs: 16 %
MCH: 30.7 pg (ref 26.6–33.0)
MCHC: 33.2 g/dL (ref 31.5–35.7)
MCV: 92 fL (ref 79–97)
Monocytes Absolute: 0.6 x10E3/uL (ref 0.1–0.9)
Monocytes: 8 %
Neutrophils Absolute: 5.5 x10E3/uL (ref 1.4–7.0)
Neutrophils: 74 %
Platelets: 241 x10E3/uL (ref 150–450)
RBC: 4.86 x10E6/uL (ref 4.14–5.80)
RDW: 12.6 % (ref 11.6–15.4)
WBC: 7.4 x10E3/uL (ref 3.4–10.8)

## 2024-06-30 LAB — IGG, IGA, IGM
IgA/Immunoglobulin A, Serum: 343 mg/dL (ref 90–386)
IgG (Immunoglobin G), Serum: 814 mg/dL (ref 603–1613)
IgM (Immunoglobulin M), Srm: 8 mg/dL — ABNORMAL LOW (ref 20–172)

## 2024-07-04 ENCOUNTER — Other Ambulatory Visit: Payer: Self-pay | Admitting: Neurology

## 2024-07-05 NOTE — Telephone Encounter (Signed)
 Last seen on 11/26/23 Follow up scheduled on 02/02/25   Please deny you approved on 06/29/24 with 1 additional refill   Rx pending

## 2024-07-06 ENCOUNTER — Encounter: Payer: Self-pay | Admitting: Neurology

## 2024-07-06 ENCOUNTER — Telehealth: Payer: Self-pay | Admitting: Neurology

## 2024-07-06 MED ORDER — OXYBUTYNIN CHLORIDE ER 5 MG PO TB24: 5.0000 mg | ORAL_TABLET | Freq: Every day | ORAL | 3 refills | Status: DC

## 2024-07-06 NOTE — Telephone Encounter (Signed)
 Refilled. Phone roo mnotify pt

## 2024-07-06 NOTE — Telephone Encounter (Signed)
 Pt called to request medication refill oxybutynin  (DITROPAN -XL) 5 MG 24 hr tablet   PT medication is to be sent to : [facility] [id number] - [place], [place] - [address]

## 2024-07-14 NOTE — Telephone Encounter (Addendum)
 Kim,RN reached out to our pod wanting to clarify dose for pt. I shared mychart from [facility].   I clarified again with Dr. Vear who approved for pt to continue 300mg  q 6 months.  Relayed to [facility]. They will call pt to get him scheduled.

## 2024-08-05 DIAGNOSIS — G35 Multiple sclerosis: Secondary | ICD-10-CM | POA: Diagnosis not present

## 2024-09-15 ENCOUNTER — Other Ambulatory Visit: Payer: Self-pay | Admitting: Neurology

## 2024-12-15 ENCOUNTER — Encounter: Payer: Self-pay | Admitting: Neurology

## 2024-12-15 MED ORDER — OXYBUTYNIN CHLORIDE ER 5 MG PO TB24: 5.0000 mg | ORAL_TABLET | Freq: Every day | ORAL | 1 refills | Status: AC

## 2024-12-15 NOTE — Telephone Encounter (Signed)
 Daniel Chavez patient Last seen on 06/29/24 Follow up scheduled on 02/02/25   Dispensed Days Supply Quantity Provider Pharmacy  MODAFINIL     TAB 200MG  10/05/2024 90 90 tablet [facility] PARAS, NP [facility]...

## 2025-01-20 ENCOUNTER — Telehealth: Payer: Self-pay | Admitting: *Deleted

## 2025-01-20 NOTE — Telephone Encounter (Signed)
 Updated ICD 10 code is now G35.A. this is being relayed back to [facility].

## 2025-01-20 NOTE — Telephone Encounter (Signed)
 Received fax from [place] regarding Ocrevus  which asks for updated ICD-10 code for pt's MS.

## 2025-02-02 ENCOUNTER — Ambulatory Visit: Admitting: Neurology
